# Patient Record
Sex: Female | Born: 1995 | Race: White | Hispanic: No | Marital: Single | Smoking: Former smoker
Health system: Southern US, Community
[De-identification: ages and names within clinical notes are randomized; demographics above are authoritative.]

## PROBLEM LIST (undated history)

## (undated) DIAGNOSIS — O139 Gestational [pregnancy-induced] hypertension without significant proteinuria, unspecified trimester: Secondary | ICD-10-CM

## (undated) DIAGNOSIS — F419 Anxiety disorder, unspecified: Secondary | ICD-10-CM

## (undated) DIAGNOSIS — O24419 Gestational diabetes mellitus in pregnancy, unspecified control: Secondary | ICD-10-CM

## (undated) DIAGNOSIS — F329 Major depressive disorder, single episode, unspecified: Secondary | ICD-10-CM

## (undated) DIAGNOSIS — F32A Depression, unspecified: Secondary | ICD-10-CM

## (undated) DIAGNOSIS — J45909 Unspecified asthma, uncomplicated: Secondary | ICD-10-CM

## (undated) HISTORY — PX: OTHER SURGICAL HISTORY: SHX169

---
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
## 2018-10-06 DIAGNOSIS — O26899 Other specified pregnancy related conditions, unspecified trimester: Secondary | ICD-10-CM | POA: Insufficient documentation

## 2018-10-06 DIAGNOSIS — Z6791 Unspecified blood type, Rh negative: Secondary | ICD-10-CM | POA: Insufficient documentation

## 2019-04-27 DIAGNOSIS — O365990 Maternal care for other known or suspected poor fetal growth, unspecified trimester, not applicable or unspecified: Secondary | ICD-10-CM

## 2019-08-08 ENCOUNTER — Emergency Department (HOSPITAL_COMMUNITY)
Admission: EM | Admit: 2019-08-08 | Discharge: 2019-08-08 | Disposition: A | Discharge status: Home / Self Care | Payer: Medicaid Other | Attending: Emergency Medicine | Admitting: Emergency Medicine

## 2019-08-08 ENCOUNTER — Other Ambulatory Visit: Payer: Self-pay

## 2019-08-08 ENCOUNTER — Encounter (HOSPITAL_COMMUNITY): Payer: Self-pay | Admitting: Emergency Medicine

## 2019-08-08 DIAGNOSIS — F17210 Nicotine dependence, cigarettes, uncomplicated: Secondary | ICD-10-CM | POA: Diagnosis not present

## 2019-08-08 DIAGNOSIS — K0889 Other specified disorders of teeth and supporting structures: Secondary | ICD-10-CM | POA: Insufficient documentation

## 2019-08-08 HISTORY — DX: Depression, unspecified: F32.A

## 2019-08-08 MED ORDER — CLINDAMYCIN HCL 150 MG PO CAPS: 300.0000 mg | ORAL_CAPSULE | Freq: Four times a day (QID) | ORAL | 0 refills | Status: DC

## 2019-08-08 MED ORDER — CHLORHEXIDINE GLUCONATE 0.12 % MT SOLN: OROMUCOSAL | 0 refills | Status: DC

## 2019-08-08 NOTE — ED Provider Notes (Signed)
[facility] EMERGENCY DEPARTMENT Provider Note   CSN: [id number] Arrival date & time: 08/08/19  0009     History Chief Complaint  Patient presents with  . Dental Pain    Kimberly Houston is a 24 y.o. female.  Patient presents to the emergency department for evaluation of dental pain.  Symptoms have been present for 2 days.  Patient reports pain in the whole right side of her mouth.  She reports that it hurts to eat and to smoke cigarettes.  She has not had any fever.        Past Medical History:  Diagnosis Date  . Depression     There are no problems to display for this patient.   Past Surgical History:  Procedure Laterality Date  . c section       OB History   No obstetric history on file.     No family history on file.  Social History   Tobacco Use  . Smoking status: Current Every Day Smoker    Types: Cigarettes  . Smokeless tobacco: Never Used  Substance Use Topics  . Alcohol use: Yes    Comment: occ  . Drug use: Not Currently    Home Medications Prior to Admission medications   Medication Sig Start Date End Date Taking? Authorizing Provider  chlorhexidine (PERIDEX) 0.12 % solution Swish and spit 15 mL 2 times a day 08/08/19   Gilda Crease, MD  clindamycin (CLEOCIN) 150 MG capsule Take 2 capsules (300 mg total) by mouth 4 (four) times daily. 08/08/19   Gilda Crease, MD    Allergies    Amoxicillin, Drug ingredient [azithromycin], Gentamicin, and Penicillins  Review of Systems   Review of Systems  HENT: Positive for dental problem.   All other systems reviewed and are negative.   Physical Exam Updated Vital Signs BP 126/68 (BP Location: Right Arm)   Pulse 73   Temp 98.1 F (36.7 C) (Oral)   Resp 17   Ht 5\' 2"  (1.575 m)   Wt 118.8 kg   LMP 07/18/2019   SpO2 100%   BMI 47.92 kg/m   Physical Exam Vitals and nursing note reviewed.  Constitutional:      General: She is not in acute distress.    Appearance: Normal  appearance. She is well-developed.  HENT:     Head: Normocephalic and atraumatic.     Comments: Diffuse gingival swelling on the right side, no obvious caries, no dental abscess    Right Ear: Hearing normal.     Left Ear: Hearing normal.     Nose: Nose normal.  Eyes:     Conjunctiva/sclera: Conjunctivae normal.     Pupils: Pupils are equal, round, and reactive to light.  Cardiovascular:     Rate and Rhythm: Regular rhythm.     Heart sounds: S1 normal and S2 normal. No murmur. No friction rub. No gallop.   Pulmonary:     Effort: Pulmonary effort is normal. No respiratory distress.     Breath sounds: Normal breath sounds.  Chest:     Chest wall: No tenderness.  Abdominal:     General: Bowel sounds are normal.     Palpations: Abdomen is soft.     Tenderness: There is no abdominal tenderness. There is no guarding or rebound. Negative signs include Murphy's sign and McBurney's sign.     Hernia: No hernia is present.  Musculoskeletal:        General: Normal range of motion.  Cervical back: Normal range of motion and neck supple.  Skin:    General: Skin is warm and dry.     Findings: No rash.  Neurological:     Mental Status: She is alert and oriented to person, place, and time.     GCS: GCS eye subscore is 4. GCS verbal subscore is 5. GCS motor subscore is 6.     Cranial Nerves: No cranial nerve deficit.     Sensory: No sensory deficit.     Coordination: Coordination normal.  Psychiatric:        Speech: Speech normal.        Behavior: Behavior normal.        Thought Content: Thought content normal.     ED Results / Procedures / Treatments   Labs (all labs ordered are listed, but only abnormal results are displayed) Labs Reviewed - No data to display  EKG None  Radiology No results found.  Procedures Procedures (including critical care time)  Medications Ordered in ED Medications - No data to display  ED Course  I have reviewed the triage vital signs and the  nursing notes.  Pertinent labs & imaging results that were available during my care of the patient were reviewed by me and considered in my medical decision making (see chart for details).    MDM Rules/Calculators/A&P                      Patient presents with diffuse dental pain.  I do not see an obvious source in the patient is experiencing pain in multiple areas on the right side of her mouth, both upper and lower.  She does have diffuse gingival swelling, will treat with Peridex and Clinda, follow-up with a dentist.  Final Clinical Impression(s) / ED Diagnoses Final diagnoses:  Pain, dental    Rx / DC Orders ED Discharge Orders         Ordered    clindamycin (CLEOCIN) 150 MG capsule  4 times daily     08/08/19 0139    chlorhexidine (PERIDEX) 0.12 % solution     08/08/19 0139           Orpah Greek, MD 08/08/19 0140

## 2019-08-08 NOTE — ED Triage Notes (Signed)
Pt c/o dental pain x 2 days.  

## 2019-08-26 ENCOUNTER — Emergency Department (HOSPITAL_COMMUNITY): Payer: Medicaid Other

## 2019-08-26 ENCOUNTER — Encounter (HOSPITAL_COMMUNITY): Payer: Self-pay | Admitting: Emergency Medicine

## 2019-08-26 ENCOUNTER — Other Ambulatory Visit: Payer: Self-pay

## 2019-08-26 ENCOUNTER — Emergency Department (HOSPITAL_COMMUNITY)
Admission: EM | Admit: 2019-08-26 | Discharge: 2019-08-26 | Disposition: A | Discharge status: Home / Self Care | Payer: Medicaid Other | Attending: Emergency Medicine | Admitting: Emergency Medicine

## 2019-08-26 DIAGNOSIS — Y990 Civilian activity done for income or pay: Secondary | ICD-10-CM | POA: Insufficient documentation

## 2019-08-26 DIAGNOSIS — X501XXA Overexertion from prolonged static or awkward postures, initial encounter: Secondary | ICD-10-CM | POA: Insufficient documentation

## 2019-08-26 DIAGNOSIS — S8392XA Sprain of unspecified site of left knee, initial encounter: Secondary | ICD-10-CM | POA: Diagnosis not present

## 2019-08-26 DIAGNOSIS — Y929 Unspecified place or not applicable: Secondary | ICD-10-CM | POA: Diagnosis not present

## 2019-08-26 DIAGNOSIS — F17210 Nicotine dependence, cigarettes, uncomplicated: Secondary | ICD-10-CM | POA: Diagnosis not present

## 2019-08-26 DIAGNOSIS — Y939 Activity, unspecified: Secondary | ICD-10-CM | POA: Insufficient documentation

## 2019-08-26 DIAGNOSIS — Z3202 Encounter for pregnancy test, result negative: Secondary | ICD-10-CM | POA: Insufficient documentation

## 2019-08-26 DIAGNOSIS — M25571 Pain in right ankle and joints of right foot: Secondary | ICD-10-CM | POA: Diagnosis not present

## 2019-08-26 DIAGNOSIS — S8992XA Unspecified injury of left lower leg, initial encounter: Secondary | ICD-10-CM | POA: Diagnosis present

## 2019-08-26 LAB — PREGNANCY, URINE: Preg Test, Ur: NEGATIVE

## 2019-08-26 MED ORDER — IBUPROFEN 800 MG PO TABS
800.0000 mg | ORAL_TABLET | Freq: Once | ORAL | Status: AC
  Administered 2019-08-26: 22:00:00 800 mg via ORAL
  Filled 2019-08-26: qty 1

## 2019-08-26 NOTE — ED Provider Notes (Signed)
[facility] EMERGENCY DEPARTMENT Provider Note   CSN: [id number] Arrival date & time: 08/26/19  1947     History Chief Complaint  Patient presents with  . Ankle Pain    Kimberly Houston is a 24 y.o. female with a past medical history significant for asthma and depression who presents to the ED due to acute on chronic right ankle pain, left knee pain, and requesting a pregnancy test.  Patient states she was at work today when she stepped wrong onto her left leg and twisted her left knee.  Admits to immediate pain following the incident.  Left knee pain associated with numbness and tingling that travels down her left leg to her left foot.  Denies previous injury to left knee.  Has not tried anything for pain prior to arrival.  Patient also admits to acute on chronic right ankle pain.  Notes she broke her ankle a few years ago and has had chronic pain since; however, pain has been worse today and she is concerned about a hairline fracture.  No further injury to right ankle.  Admits to intermittent numbness/tingling of her right foot.  Patient also notes she is sexually active without protection and has possible concerns about pregnancy.  She is currently on her period, but admits to intermittent nausea and breast tenderness.  Patient denies fever, chills, erythema, and warmth to right ankle/left knee.  History obtained from patient and past medical records. No interpreter used during encounter.      Past Medical History:  Diagnosis Date  . Depression     There are no problems to display for this patient.   Past Surgical History:  Procedure Laterality Date  . c section       OB History   No obstetric history on file.     No family history on file.  Social History   Tobacco Use  . Smoking status: Current Every Day Smoker    Types: Cigarettes  . Smokeless tobacco: Never Used  Substance Use Topics  . Alcohol use: Yes    Comment: occ  . Drug use: Not Currently    Home  Medications Prior to Admission medications   Medication Sig Start Date End Date Taking? Authorizing Provider  chlorhexidine (PERIDEX) 0.12 % solution Swish and spit 15 mL 2 times a day 08/08/19   Gilda Crease, MD  clindamycin (CLEOCIN) 150 MG capsule Take 2 capsules (300 mg total) by mouth 4 (four) times daily. 08/08/19   Gilda Crease, MD    Allergies    Amoxicillin, Drug ingredient [azithromycin], Gentamicin, and Penicillins  Review of Systems   Review of Systems  Constitutional: Negative for chills and fever.  Gastrointestinal: Positive for nausea. Negative for abdominal pain, diarrhea and vomiting.  Musculoskeletal: Positive for arthralgias.  Neurological: Positive for numbness.  All other systems reviewed and are negative.   Physical Exam Updated Vital Signs BP 124/70 (BP Location: Right Arm)   Pulse 84   Temp 98.8 F (37.1 C) (Oral)   Resp 18   Ht 5\' 2"  (1.575 m)   Wt 118 kg   LMP 08/25/2019   SpO2 100%   BMI 47.58 kg/m   Physical Exam Vitals and nursing note reviewed.  Constitutional:      General: She is not in acute distress.    Appearance: She is not ill-appearing.  HENT:     Head: Normocephalic.  Eyes:     Pupils: Pupils are equal, round, and reactive to light.  Cardiovascular:  Rate and Rhythm: Normal rate and regular rhythm.     Pulses: Normal pulses.     Heart sounds: Normal heart sounds. No murmur. No friction rub. No gallop.   Pulmonary:     Effort: Pulmonary effort is normal.     Breath sounds: Normal breath sounds.  Abdominal:     General: Abdomen is flat. Bowel sounds are normal. There is no distension.     Palpations: Abdomen is soft.     Tenderness: There is no abdominal tenderness. There is no guarding or rebound.  Musculoskeletal:     Cervical back: Neck supple.     Comments: Tenderness palpation throughout right ankle.  Limited range of motion of right ankle due to pain.  Full range of motion of right knee and right  toes.  Right lower extremity neurovascularly intact.  Tenderness palpation over anterior aspect of left knee.  Moderate range of motion of left knee.  Left lower extremity neurovascularly intact.  Full range of motion of left hip and left ankle.  No erythema, edema, or warmth of either right ankle or left knee.  Skin:    General: Skin is warm and dry.  Neurological:     General: No focal deficit present.     Mental Status: She is alert.  Psychiatric:        Mood and Affect: Mood normal.        Behavior: Behavior normal.     ED Results / Procedures / Treatments   Labs (all labs ordered are listed, but only abnormal results are displayed) Labs Reviewed  PREGNANCY, URINE    EKG None  Radiology DG Ankle Complete Right  Result Date: 08/26/2019 CLINICAL DATA:  Pain, previous injury EXAM: RIGHT ANKLE - COMPLETE 3+ VIEW COMPARISON:  None. FINDINGS: There is no evidence of fracture, dislocation, or joint effusion. There is no evidence of arthropathy or other focal bone abnormality. Soft tissues are unremarkable. IMPRESSION: Negative. Electronically Signed   By: Jasmine Pang M.D.   On: 08/26/2019 22:08   DG Knee Complete 4 Views Left  Result Date: 08/26/2019 CLINICAL DATA:  Pain, previous injury EXAM: LEFT KNEE - COMPLETE 4+ VIEW COMPARISON:  None. FINDINGS: No evidence of fracture, dislocation, or joint effusion. No evidence of arthropathy or other focal bone abnormality. Soft tissues are unremarkable. IMPRESSION: Negative. Electronically Signed   By: Jasmine Pang M.D.   On: 08/26/2019 22:08    Procedures Procedures (including critical care time)  Medications Ordered in ED Medications  ibuprofen (ADVIL) tablet 800 mg (800 mg Oral Given 08/26/19 2133)    ED Course  I have reviewed the triage vital signs and the nursing notes.  Pertinent labs & imaging results that were available during my care of the patient were reviewed by me and considered in my medical decision making (see chart  for details).  Clinical Course as of Aug 25 2249  Sat Aug 26, 2019  2248 Preg Test, Ur: NEGATIVE [CA]    Clinical Course User Index [CA] Mannie Stabile, PA-C   MDM Rules/Calculators/A&P                     24 year old female presents to the ED due to right ankle pain, left knee pain, and requesting pregnancy test.  Patient admits to a remote injury to her right ankle and has been having chronic pain since.  Patient twisted her left knee earlier today while at work.  Vitals all within normal limits.  Patient no  acute distress and non-ill-appearing.  Physical exam reassuring.  Both lower extremities neurovascularly intact.  No erythema, edema, or warmth to suggest septic arthritis.  Will obtain x-ray of right ankle, and left knee to rule out bony fractures.  Also obtain pregnancy test.  Pregnancy test negative.  X-rays personally reviewed which are negative for bony fractures.  Will place right ankle in splint for comfort.  Ace wrap placed around left knee for stability and comfort.  Instructed patient to take over-the-counter ibuprofen or Tylenol as needed for pain.  Advised patient to follow-up with PCP if symptoms do not improve within the next week. Strict ED precautions discussed with patient. Patient states understanding and agrees to plan. Patient discharged home in no acute distress and stable vitals. Final Clinical Impression(s) / ED Diagnoses Final diagnoses:  Acute right ankle pain  Sprain of left knee, unspecified ligament, initial encounter  Pregnancy examination or test, negative result    Rx / DC Orders ED Discharge Orders    None       Karie Kirks 08/26/19 2253    Margette Fast, MD 08/27/19 1205

## 2019-08-26 NOTE — Discharge Instructions (Addendum)
As discussed, your x-rays were negative for any broken bones.  Your pregnancy test was negative.  You may take over-the-counter ibuprofen or Tylenol as needed for pain.  If your symptoms do not improve within the next week, follow-up with PCP for further evaluation.  Continue to ice and elevate your right ankle and left knee for further pain management.  Return to the ER for new or worsening symptoms.

## 2019-08-26 NOTE — ED Triage Notes (Signed)
Pt c/o right ankle pain and left knee pain that began today at work. Patient also request a pregnancy test.

## 2019-08-31 ENCOUNTER — Emergency Department (HOSPITAL_COMMUNITY)
Admission: EM | Admit: 2019-08-31 | Discharge: 2019-08-31 | Disposition: A | Discharge status: Home / Self Care | Payer: Medicaid Other | Attending: Emergency Medicine | Admitting: Emergency Medicine

## 2019-08-31 ENCOUNTER — Encounter (HOSPITAL_COMMUNITY): Payer: Self-pay | Admitting: Emergency Medicine

## 2019-08-31 ENCOUNTER — Other Ambulatory Visit: Payer: Self-pay

## 2019-08-31 DIAGNOSIS — N3090 Cystitis, unspecified without hematuria: Secondary | ICD-10-CM | POA: Diagnosis not present

## 2019-08-31 DIAGNOSIS — J45909 Unspecified asthma, uncomplicated: Secondary | ICD-10-CM | POA: Insufficient documentation

## 2019-08-31 DIAGNOSIS — M545 Low back pain: Secondary | ICD-10-CM | POA: Insufficient documentation

## 2019-08-31 DIAGNOSIS — R3911 Hesitancy of micturition: Secondary | ICD-10-CM | POA: Diagnosis not present

## 2019-08-31 DIAGNOSIS — R1030 Lower abdominal pain, unspecified: Secondary | ICD-10-CM | POA: Diagnosis not present

## 2019-08-31 DIAGNOSIS — R300 Dysuria: Secondary | ICD-10-CM | POA: Diagnosis present

## 2019-08-31 DIAGNOSIS — F17210 Nicotine dependence, cigarettes, uncomplicated: Secondary | ICD-10-CM | POA: Diagnosis not present

## 2019-08-31 HISTORY — DX: Unspecified asthma, uncomplicated: J45.909

## 2019-08-31 LAB — URINALYSIS, ROUTINE W REFLEX MICROSCOPIC
Bilirubin Urine: NEGATIVE
Glucose, UA: NEGATIVE mg/dL
Ketones, ur: NEGATIVE mg/dL
Nitrite: NEGATIVE
Protein, ur: 100 mg/dL — AB
Specific Gravity, Urine: 1.016 (ref 1.005–1.030)
WBC, UA: >50 WBC/hpf — ABNORMAL HIGH (ref 0–5)
pH: 5 (ref 5.0–8.0)

## 2019-08-31 LAB — POC URINE PREG, ED: Preg Test, Ur: NEGATIVE

## 2019-08-31 MED ORDER — CEPHALEXIN 500 MG PO CAPS: 500.0000 mg | ORAL_CAPSULE | Freq: Three times a day (TID) | ORAL | 0 refills | Status: DC

## 2019-08-31 MED ORDER — CEPHALEXIN 500 MG PO CAPS: 500.0000 mg | ORAL_CAPSULE | Freq: Three times a day (TID) | ORAL | 0 refills | Status: AC

## 2019-08-31 NOTE — ED Triage Notes (Signed)
Pt reports dysuria, urinary frequency without complete emptying x2 days. Pt denies hematuria, n/v/fever. Pt reports intermittent lower abdominal/bladder pain.

## 2019-08-31 NOTE — ED Provider Notes (Signed)
[facility] EMERGENCY DEPARTMENT Provider Note  CSN: [id number] Arrival date & time: 08/31/19 1124    History Chief Complaint  Patient presents with  . Dysuria    HPI  Kimberly Houston is a 24 y.o. female with no significant past medical history who presents to the ED for evaluation of two days of dysuria. Reports stabbing pain in suprapubic area, worse with urination and associated with urinary hesitancy. She denies fever or vomiting. She reports some bilateral lower back pain.    Past Medical History:  Diagnosis Date  . Asthma   . Depression     Past Surgical History:  Procedure Laterality Date  . c section    . TYMPANOSTOMY TUBE PLACEMENT  2018    History reviewed. No pertinent family history.  Social History   Tobacco Use  . Smoking status: Current Every Day Smoker    Packs/day: 0.50    Types: Cigarettes  . Smokeless tobacco: Never Used  Substance Use Topics  . Alcohol use: Yes    Comment: occ  . Drug use: Not Currently     Home Medications Prior to Admission medications   Medication Sig Start Date End Date Taking? Authorizing Provider  cephALEXin (KEFLEX) 500 MG capsule Take 1 capsule (500 mg total) by mouth 3 (three) times daily for 7 days. 08/31/19 09/07/19  Pollyann Savoy, MD  chlorhexidine (PERIDEX) 0.12 % solution Swish and spit 15 mL 2 times a day 08/08/19   Gilda Crease, MD     Allergies    Amoxicillin, Drug ingredient [azithromycin], Gentamicin, and Penicillins   Review of Systems   Review of Systems  Constitutional: Negative for fever.  HENT: Negative for congestion and sore throat.   Respiratory: Negative for cough and shortness of breath.   Cardiovascular: Negative for chest pain.  Gastrointestinal: Negative for abdominal pain, diarrhea, nausea and vomiting.  Genitourinary: Positive for difficulty urinating, dysuria and frequency.  Musculoskeletal: Positive for back pain. Negative for myalgias.  Skin: Negative for rash.    Neurological: Negative for headaches.  Psychiatric/Behavioral: Negative for behavioral problems.     Physical Exam BP 123/68 (BP Location: Right Arm)   Pulse 84   Temp 98.3 F (36.8 C) (Oral)   Resp 18   Ht 5\' 2"  (1.575 m)   Wt 118 kg   LMP 08/25/2019   SpO2 100%   BMI 47.58 kg/m   Physical Exam Constitutional:      Appearance: Normal appearance.  HENT:     Head: Normocephalic and atraumatic.     Nose: Nose normal.     Mouth/Throat:     Mouth: Mucous membranes are moist.  Eyes:     Extraocular Movements: Extraocular movements intact.     Conjunctiva/sclera: Conjunctivae normal.  Cardiovascular:     Rate and Rhythm: Normal rate.  Pulmonary:     Effort: Pulmonary effort is normal.     Breath sounds: Normal breath sounds.  Abdominal:     General: Abdomen is flat.     Palpations: Abdomen is soft.     Tenderness: There is no abdominal tenderness.  Genitourinary:    Comments: No CVA tenderness Musculoskeletal:        General: No swelling. Normal range of motion.     Cervical back: Neck supple.  Skin:    General: Skin is warm and dry.  Neurological:     General: No focal deficit present.     Mental Status: She is alert.  Psychiatric:  Mood and Affect: Mood normal.      ED Results / Procedures / Treatments   Labs (all labs ordered are listed, but only abnormal results are displayed) Labs Reviewed  URINALYSIS, ROUTINE W REFLEX MICROSCOPIC - Abnormal; Notable for the following components:      Result Value   APPearance CLOUDY (*)    Hgb urine dipstick LARGE (*)    Protein, ur 100 (*)    Leukocytes,Ua MODERATE (*)    WBC, UA >50 (*)    Bacteria, UA RARE (*)    All other components within normal limits  POC URINE PREG, ED    EKG None  Radiology No results found.  Procedures Procedures  Medications Ordered in the ED Medications - No data to display   ED Course  I have reviewed the triage vital signs and the nursing notes.  Pertinent  labs & imaging results that were available during my care of the patient were reviewed by me and considered in my medical decision making (see chart for details).  Clinical Course as of Aug 30 1517  Thu Aug 31, 2019  1516 Patient non toxic appearing with symptoms and UA consistent with acute cystitis. Doubt pyelonephritis or kidney stone given lack of unilateral CVA pain or colic. She has had rash with PCN, but should be safe for Keflex. Recommend PCP followup for recheck and RTED if symptoms worse, for fever, vomiting or any other concerns.    [CS]    Clinical Course User Index [CS] Truddie Hidden, MD    MDM Rules/Calculators/A&P MDM  Final Clinical Impression(s) / ED Diagnoses Final diagnoses:  Cystitis    Rx / DC Orders ED Discharge Orders         Ordered    cephALEXin (KEFLEX) 500 MG capsule  3 times daily     08/31/19 1518           Truddie Hidden, MD 08/31/19 [phone]

## 2019-09-14 ENCOUNTER — Other Ambulatory Visit: Payer: Self-pay

## 2019-09-14 ENCOUNTER — Emergency Department (HOSPITAL_COMMUNITY)
Admission: EM | Admit: 2019-09-14 | Discharge: 2019-09-15 | Disposition: A | Discharge status: Other Acute Inpatient Hospital | Payer: Medicaid Other | Attending: Emergency Medicine | Admitting: Emergency Medicine

## 2019-09-14 ENCOUNTER — Encounter (HOSPITAL_COMMUNITY): Payer: Self-pay

## 2019-09-14 DIAGNOSIS — F17210 Nicotine dependence, cigarettes, uncomplicated: Secondary | ICD-10-CM | POA: Insufficient documentation

## 2019-09-14 DIAGNOSIS — R45851 Suicidal ideations: Secondary | ICD-10-CM | POA: Diagnosis not present

## 2019-09-14 DIAGNOSIS — Z20822 Contact with and (suspected) exposure to covid-19: Secondary | ICD-10-CM | POA: Diagnosis not present

## 2019-09-14 DIAGNOSIS — F332 Major depressive disorder, recurrent severe without psychotic features: Secondary | ICD-10-CM | POA: Insufficient documentation

## 2019-09-14 DIAGNOSIS — Z79899 Other long term (current) drug therapy: Secondary | ICD-10-CM | POA: Diagnosis not present

## 2019-09-14 LAB — RAPID URINE DRUG SCREEN, HOSP PERFORMED
Amphetamines: NOT DETECTED
Barbiturates: NOT DETECTED
Benzodiazepines: NOT DETECTED
Cocaine: NOT DETECTED
Opiates: NOT DETECTED
Tetrahydrocannabinol: NOT DETECTED

## 2019-09-14 LAB — CBC
HCT: 37.9 % (ref 36.0–46.0)
Hemoglobin: 12.1 g/dL (ref 12.0–15.0)
MCH: 28.5 pg (ref 26.0–34.0)
MCHC: 31.9 g/dL (ref 30.0–36.0)
MCV: 89.4 fL (ref 80.0–100.0)
Platelets: 403 10*3/uL — ABNORMAL HIGH (ref 150–400)
RBC: 4.24 MIL/uL (ref 3.87–5.11)
RDW: 13.9 % (ref 11.5–15.5)
WBC: 10.2 10*3/uL (ref 4.0–10.5)
nRBC: 0 % (ref 0.0–0.2)

## 2019-09-14 LAB — COMPREHENSIVE METABOLIC PANEL
ALT: 25 U/L (ref 0–44)
AST: 20 U/L (ref 15–41)
Albumin: 4 g/dL (ref 3.5–5.0)
Alkaline Phosphatase: 61 U/L (ref 38–126)
Anion gap: 10 (ref 5–15)
BUN: 14 mg/dL (ref 6–20)
CO2: 23 mmol/L (ref 22–32)
Calcium: 8.8 mg/dL — ABNORMAL LOW (ref 8.9–10.3)
Chloride: 101 mmol/L (ref 98–111)
Creatinine, Ser: 0.81 mg/dL (ref 0.44–1.00)
GFR calc Af Amer: >60 mL/min (ref >60)
GFR calc non Af Amer: >60 mL/min (ref >60)
Glucose, Bld: 95 mg/dL (ref 70–99)
Potassium: 3.6 mmol/L (ref 3.5–5.1)
Sodium: 134 mmol/L — ABNORMAL LOW (ref 135–145)
Total Bilirubin: 0.8 mg/dL (ref 0.3–1.2)
Total Protein: 7.9 g/dL (ref 6.5–8.1)

## 2019-09-14 LAB — ACETAMINOPHEN LEVEL: Acetaminophen (Tylenol), Serum: <10 ug/mL — ABNORMAL LOW (ref 10–30)

## 2019-09-14 LAB — RESPIRATORY PANEL BY RT PCR (FLU A&B, COVID)
Influenza A by PCR: NEGATIVE
Influenza B by PCR: NEGATIVE
SARS Coronavirus 2 by RT PCR: NEGATIVE

## 2019-09-14 LAB — ETHANOL: Alcohol, Ethyl (B): <10 mg/dL (ref <10)

## 2019-09-14 LAB — POC URINE PREG, ED: Preg Test, Ur: NEGATIVE

## 2019-09-14 LAB — SALICYLATE LEVEL: Salicylate Lvl: <7 mg/dL — ABNORMAL LOW (ref 7.0–30.0)

## 2019-09-14 MED ORDER — SERTRALINE HCL 50 MG PO TABS
25.0000 mg | ORAL_TABLET | Freq: Every day | ORAL | Status: DC
  Administered 2019-09-14: 25 mg via ORAL
  Filled 2019-09-14: qty 1

## 2019-09-14 MED ORDER — ALBUTEROL SULFATE HFA 108 (90 BASE) MCG/ACT IN AERS: 2.0000 | INHALATION_SPRAY | Freq: Four times a day (QID) | RESPIRATORY_TRACT | Status: DC | PRN

## 2019-09-14 NOTE — Progress Notes (Signed)
Patient meets inpatient criteria per Renaye Rakers, NP. Patient has been faxed out to the following facilities for review:   [facility]  [facility]  [facility]  [facility]  [facility]  TTS will continue to follow and assist with securing bed placement.   Drucilla Schmidt, MSW, LCSW-A Clinical Disposition Social Worker [facility]/TTS [phone]
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
## 2019-09-14 NOTE — ED Notes (Signed)
Pt. Talking to TTS. 

## 2019-09-14 NOTE — ED Provider Notes (Signed)
[facility] EMERGENCY DEPARTMENT Provider Note   CSN: [id number] Arrival date & time: 09/14/19  1756     History Chief Complaint  Patient presents with  . V70.1    Kimberly Houston is a 24 y.o. female.  HPI   24 year old female with a history of asthma, anxiety/depression, who presents to the emergency department today for evaluation of suicidal ideation.  States she has been feeling suicidal recently because the 1 year anniversary of her uncles death just past.  She states that he was like a father to her and they were very close.  She is really struggling with his loss.  States she has a plan to get into bathtub and slit her wrists.  She has had suicidal ideations in the past that she has acted on.  She reports taking Zoloft for depression and has been compliant with this.  She is not currently in therapy.  She denies any HI or AVH.  Denies any drug or alcohol use.  She denies any medical complaints at this time.  Past Medical History:  Diagnosis Date  . Asthma   . Depression     There are no problems to display for this patient.   Past Surgical History:  Procedure Laterality Date  . c section    . TYMPANOSTOMY TUBE PLACEMENT  2018     OB History   No obstetric history on file.     No family history on file.  Social History   Tobacco Use  . Smoking status: Current Every Day Smoker    Packs/day: 0.50    Types: Cigarettes  . Smokeless tobacco: Never Used  Substance Use Topics  . Alcohol use: Yes    Comment: occ  . Drug use: Not Currently    Home Medications Prior to Admission medications   Medication Sig Start Date End Date Taking? Authorizing Provider  albuterol (VENTOLIN HFA) 108 (90 Base) MCG/ACT inhaler Inhale 2 puffs into the lungs every 6 (six) hours as needed for wheezing or shortness of breath.   Yes [provider]  sertraline (ZOLOFT) 25 MG tablet Take 25 mg by mouth at bedtime. 07/27/19  Yes [provider]  chlorhexidine  (PERIDEX) 0.12 % solution Swish and spit 15 mL 2 times a day Patient not taking: Reported on 09/14/2019 08/08/19   Orpah Greek, MD    Allergies    Amoxicillin, Drug ingredient [azithromycin], Erythromycin, Gentamicin, Latex, Metronidazole, and Penicillins  Review of Systems   Review of Systems  Constitutional: Negative for chills and fever.  HENT: Negative for ear pain and sore throat.   Eyes: Negative for visual disturbance.  Respiratory: Negative for cough and shortness of breath.   Cardiovascular: Negative for chest pain.  Gastrointestinal: Negative for abdominal pain, constipation, diarrhea, nausea and vomiting.  Genitourinary: Negative for dysuria and hematuria.  Musculoskeletal: Negative for back pain.  Skin: Negative for rash.  Neurological: Negative for headaches.  Psychiatric/Behavioral: Positive for dysphoric mood and suicidal ideas.  All other systems reviewed and are negative.   Physical Exam Updated Vital Signs BP 118/68 (BP Location: Left Arm)   Pulse 91   Temp 98.3 F (36.8 C) (Oral)   Resp 18   Ht 5\' 2"  (1.575 m)   Wt 113.4 kg   LMP 08/25/2019   SpO2 99%   BMI 45.73 kg/m   Physical Exam Vitals and nursing note reviewed.  Constitutional:      General: She is not in acute distress.    Appearance: She  is well-developed.  HENT:     Head: Normocephalic and atraumatic.  Eyes:     Conjunctiva/sclera: Conjunctivae normal.  Cardiovascular:     Rate and Rhythm: Normal rate and regular rhythm.     Heart sounds: No murmur.  Pulmonary:     Effort: Pulmonary effort is normal. No respiratory distress.     Breath sounds: Normal breath sounds.  Abdominal:     Palpations: Abdomen is soft.     Tenderness: There is no abdominal tenderness.  Musculoskeletal:     Cervical back: Neck supple.  Skin:    General: Skin is warm and dry.  Neurological:     Mental Status: She is alert.  Psychiatric:        Attention and Perception: Attention normal.         Mood and Affect: Affect is flat.        Speech: Speech normal.        Behavior: Behavior normal. Behavior is cooperative.        Thought Content: Thought content is not paranoid or delusional. Thought content includes suicidal ideation. Thought content does not include homicidal ideation. Thought content includes suicidal plan. Thought content does not include homicidal plan.     ED Results / Procedures / Treatments   Labs (all labs ordered are listed, but only abnormal results are displayed) Labs Reviewed  COMPREHENSIVE METABOLIC PANEL - Abnormal; Notable for the following components:      Result Value   Sodium 134 (*)    Calcium 8.8 (*)    All other components within normal limits  SALICYLATE LEVEL - Abnormal; Notable for the following components:   Salicylate Lvl <7.0 (*)    All other components within normal limits  ACETAMINOPHEN LEVEL - Abnormal; Notable for the following components:   Acetaminophen (Tylenol), Serum <10 (*)    All other components within normal limits  CBC - Abnormal; Notable for the following components:   Platelets 403 (*)    All other components within normal limits  RESPIRATORY PANEL BY RT PCR (FLU A&B, COVID)  ETHANOL  RAPID URINE DRUG SCREEN, HOSP PERFORMED  POC URINE PREG, ED    EKG None  Radiology No results found.  Procedures Procedures (including critical care time)  Medications Ordered in ED Medications  albuterol (VENTOLIN HFA) 108 (90 Base) MCG/ACT inhaler 2 puff (has no administration in time range)  sertraline (ZOLOFT) tablet 25 mg (has no administration in time range)    ED Course  I have reviewed the triage vital signs and the nursing notes.  Pertinent labs & imaging results that were available during my care of the patient were reviewed by me and considered in my medical decision making (see chart for details).    MDM Rules/Calculators/A&P                      24 year old female presenting for evaluation of suicidal  ideation with plan to cut her wrists while in the bathtub.  Denies HI or AVH.  No drug or alcohol use.  Does not have medical complaints at this time.  CBC is without leukocytosis.  She does have mild thrombocytosis CMP is nonacute EtOH is negative Salicylate and acetaminophen levels are negative. Urine pregnancy test is negative UDS is negative  Patient does not appear to have any emergent medical complaint at this time that would require further work-up or admission.  Feel she is appropriate for TTS evaluation.  Pt evaluated by psych and  meets inpatient criteria. Home meds ordered.   The patient has been placed in psychiatric observation due to the need to provide a safe environment for the patient while obtaining psychiatric consultation and evaluation, as well as ongoing medical and medication management to treat the patient's condition.  The patient has not been placed under full IVC at this time.   Final Clinical Impression(s) / ED Diagnoses Final diagnoses:  Suicidal ideation    Rx / DC Orders ED Discharge Orders    None       Rayne Du 09/14/19 2150    Zadie Rhine, MD 09/15/19 0320

## 2019-09-14 NOTE — ED Triage Notes (Signed)
Pt reports has been feeling suicidal for the past 2 days.  Pt says her uncle died a year ago and has been very depressed.

## 2019-09-15 NOTE — ED Notes (Signed)
Spoke with Herbert Seta from intake dept. At [facility] intake dept. Who reports pt has been accepted to [facility] in [place] by Dr Barry Dienes on behalf of Norm Parcel and that pt can be transported after nurse to nurse report is given. RN# to call report- [phone]

## 2019-09-15 NOTE — ED Notes (Signed)
[facility] called to give report- informed by RN to call back when transportation arrives to give report. Safe transport called at this time. Pt made aware.

## 2019-10-08 ENCOUNTER — Other Ambulatory Visit: Payer: Self-pay

## 2019-10-08 ENCOUNTER — Emergency Department (HOSPITAL_COMMUNITY): Payer: Medicaid Other

## 2019-10-08 ENCOUNTER — Emergency Department (HOSPITAL_COMMUNITY)
Admission: EM | Admit: 2019-10-08 | Discharge: 2019-10-09 | Disposition: A | Discharge status: Home / Self Care | Payer: Medicaid Other | Attending: Emergency Medicine | Admitting: Emergency Medicine

## 2019-10-08 ENCOUNTER — Encounter (HOSPITAL_COMMUNITY): Payer: Self-pay | Admitting: Emergency Medicine

## 2019-10-08 DIAGNOSIS — Z5321 Procedure and treatment not carried out due to patient leaving prior to being seen by health care provider: Secondary | ICD-10-CM | POA: Insufficient documentation

## 2019-10-08 DIAGNOSIS — M25511 Pain in right shoulder: Secondary | ICD-10-CM | POA: Insufficient documentation

## 2019-10-08 HISTORY — DX: Anxiety disorder, unspecified: F41.9
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
## 2019-10-08 NOTE — ED Notes (Signed)
Pt stated wants PA know  Having pain around her C-section scar C-section occurred 6 months ago

## 2019-10-09 NOTE — ED Notes (Signed)
Pt stating she wants to go home.

## 2019-10-10 ENCOUNTER — Other Ambulatory Visit: Payer: Self-pay

## 2019-10-10 ENCOUNTER — Ambulatory Visit: Payer: Medicaid Other | Attending: Internal Medicine

## 2019-10-10 DIAGNOSIS — Z20822 Contact with and (suspected) exposure to covid-19: Secondary | ICD-10-CM

## 2019-10-11 LAB — SARS-COV-2, NAA 2 DAY TAT

## 2019-10-11 LAB — NOVEL CORONAVIRUS, NAA: SARS-CoV-2, NAA: NOT DETECTED

## 2020-02-06 ENCOUNTER — Emergency Department (HOSPITAL_COMMUNITY)
Admission: EM | Admit: 2020-02-06 | Discharge: 2020-02-06 | Disposition: A | Discharge status: Home / Self Care | Payer: Medicaid Other | Attending: Emergency Medicine | Admitting: Emergency Medicine

## 2020-02-06 ENCOUNTER — Encounter (HOSPITAL_COMMUNITY): Payer: Self-pay | Admitting: *Deleted

## 2020-02-06 ENCOUNTER — Other Ambulatory Visit: Payer: Self-pay

## 2020-02-06 DIAGNOSIS — R112 Nausea with vomiting, unspecified: Secondary | ICD-10-CM | POA: Diagnosis not present

## 2020-02-06 DIAGNOSIS — J45909 Unspecified asthma, uncomplicated: Secondary | ICD-10-CM | POA: Insufficient documentation

## 2020-02-06 DIAGNOSIS — Z7951 Long term (current) use of inhaled steroids: Secondary | ICD-10-CM | POA: Diagnosis not present

## 2020-02-06 DIAGNOSIS — J029 Acute pharyngitis, unspecified: Secondary | ICD-10-CM | POA: Insufficient documentation

## 2020-02-06 DIAGNOSIS — Z20822 Contact with and (suspected) exposure to covid-19: Secondary | ICD-10-CM | POA: Diagnosis not present

## 2020-02-06 DIAGNOSIS — F17210 Nicotine dependence, cigarettes, uncomplicated: Secondary | ICD-10-CM | POA: Insufficient documentation

## 2020-02-06 DIAGNOSIS — R1110 Vomiting, unspecified: Secondary | ICD-10-CM

## 2020-02-06 LAB — CBC
HCT: 38.1 % (ref 36.0–46.0)
Hemoglobin: 12.1 g/dL (ref 12.0–15.0)
MCH: 28.7 pg (ref 26.0–34.0)
MCHC: 31.8 g/dL (ref 30.0–36.0)
MCV: 90.5 fL (ref 80.0–100.0)
Platelets: 319 10*3/uL (ref 150–400)
RBC: 4.21 MIL/uL (ref 3.87–5.11)
RDW: 14.3 % (ref 11.5–15.5)
WBC: 6.7 10*3/uL (ref 4.0–10.5)
nRBC: 0 % (ref 0.0–0.2)

## 2020-02-06 LAB — URINALYSIS, ROUTINE W REFLEX MICROSCOPIC
Bilirubin Urine: NEGATIVE
Glucose, UA: NEGATIVE mg/dL
Hgb urine dipstick: NEGATIVE
Ketones, ur: NEGATIVE mg/dL
Leukocytes,Ua: NEGATIVE
Nitrite: NEGATIVE
Protein, ur: NEGATIVE mg/dL
Specific Gravity, Urine: 1.021 (ref 1.005–1.030)
pH: 6 (ref 5.0–8.0)

## 2020-02-06 LAB — COMPREHENSIVE METABOLIC PANEL
ALT: 17 U/L (ref 0–44)
AST: 15 U/L (ref 15–41)
Albumin: 3.6 g/dL (ref 3.5–5.0)
Alkaline Phosphatase: 43 U/L (ref 38–126)
Anion gap: 6 (ref 5–15)
BUN: 13 mg/dL (ref 6–20)
CO2: 27 mmol/L (ref 22–32)
Calcium: 9.1 mg/dL (ref 8.9–10.3)
Chloride: 105 mmol/L (ref 98–111)
Creatinine, Ser: 0.77 mg/dL (ref 0.44–1.00)
GFR calc Af Amer: >60 mL/min (ref >60)
GFR calc non Af Amer: >60 mL/min (ref >60)
Glucose, Bld: 97 mg/dL (ref 70–99)
Potassium: 4.4 mmol/L (ref 3.5–5.1)
Sodium: 138 mmol/L (ref 135–145)
Total Bilirubin: 0.9 mg/dL (ref 0.3–1.2)
Total Protein: 6.9 g/dL (ref 6.5–8.1)

## 2020-02-06 LAB — SARS CORONAVIRUS 2 BY RT PCR (HOSPITAL ORDER, PERFORMED IN ~~LOC~~ HOSPITAL LAB): SARS Coronavirus 2: NEGATIVE

## 2020-02-06 LAB — LIPASE, BLOOD: Lipase: 39 U/L (ref 11–51)

## 2020-02-06 LAB — GROUP A STREP BY PCR: Group A Strep by PCR: NOT DETECTED

## 2020-02-06 NOTE — ED Triage Notes (Signed)
Pt c/o sob, n/v and sore throat that started x one day

## 2020-02-06 NOTE — ED Notes (Signed)
Pt's POC urine preg test is negative. Results not transferring to electronic chart. EDP notified.

## 2020-02-06 NOTE — Discharge Instructions (Signed)
Return if any problems.

## 2020-02-07 LAB — POC URINE PREG, ED: Preg Test, Ur: NEGATIVE

## 2020-02-07 NOTE — ED Provider Notes (Signed)
[facility] EMERGENCY DEPARTMENT Provider Note   CSN: [id number] Arrival date & time: 02/06/20  1121     History Chief Complaint  Patient presents with  . Sore Throat    Kimberly Houston is a 24 y.o. female.  The history is provided by the patient. No language interpreter was used.  Sore Throat This is a new problem. The current episode started 12 to 24 hours ago. The problem occurs constantly. The problem has not changed since onset.Nothing aggravates the symptoms. Nothing relieves the symptoms. She has tried nothing for the symptoms. The treatment provided no relief.       Past Medical History:  Diagnosis Date  . Anxiety   . Asthma   . Depression     There are no problems to display for this patient.   Past Surgical History:  Procedure Laterality Date  . c section    . TYMPANOSTOMY TUBE PLACEMENT  2018     OB History   No obstetric history on file.     History reviewed. No pertinent family history.  Social History   Tobacco Use  . Smoking status: Current Every Day Smoker    Packs/day: 0.50    Types: Cigarettes  . Smokeless tobacco: Never Used  Vaping Use  . Vaping Use: Every day  Substance Use Topics  . Alcohol use: Yes    Comment: occ  . Drug use: Not Currently    Home Medications Prior to Admission medications   Medication Sig Start Date End Date Taking? Authorizing Provider  albuterol (VENTOLIN HFA) 108 (90 Base) MCG/ACT inhaler Inhale 2 puffs into the lungs every 6 (six) hours as needed for wheezing or shortness of breath.    [provider]  hydrOXYzine (ATARAX/VISTARIL) 25 MG tablet Take 25 mg by mouth daily as needed for itching (allergies).  09/26/19   [provider]  sertraline (ZOLOFT) 100 MG tablet Take 100 mg by mouth at bedtime. 09/19/19   [provider]  Vitamin D, Ergocalciferol, (DRISDOL) 1.25 MG (50000 UNIT) CAPS capsule Take 50,000 Units by mouth every Saturday.  09/19/19   [provider]     Allergies    Amoxicillin, Drug ingredient [azithromycin], Erythromycin, Gentamicin, Metronidazole, Penicillins, and Latex  Review of Systems   Review of Systems  All other systems reviewed and are negative.   Physical Exam Updated Vital Signs BP 126/62   Pulse 65   Temp 98.9 F (37.2 C) (Oral)   Resp 16   Ht 5\' 2"  (1.575 m)   Wt 113.4 kg   SpO2 100%   BMI 45.73 kg/m   Physical Exam Vitals and nursing note reviewed.  Constitutional:      Appearance: She is well-developed.  HENT:     Head: Normocephalic.     Mouth/Throat:     Mouth: Mucous membranes are moist.  Cardiovascular:     Rate and Rhythm: Normal rate.  Pulmonary:     Effort: Pulmonary effort is normal.  Abdominal:     General: There is no distension.     Palpations: Abdomen is soft.  Musculoskeletal:        General: Normal range of motion.     Cervical back: Normal range of motion.  Skin:    General: Skin is warm.  Neurological:     General: No focal deficit present.     Mental Status: She is alert and oriented to person, place, and time.     ED Results / Procedures / Treatments  Labs (all labs ordered are listed, but only abnormal results are displayed) Labs Reviewed  URINALYSIS, ROUTINE W REFLEX MICROSCOPIC - Abnormal; Notable for the following components:      Result Value   APPearance HAZY (*)    All other components within normal limits  SARS CORONAVIRUS 2 BY RT PCR (HOSPITAL ORDER, PERFORMED IN [facility])  GROUP A STREP BY PCR  LIPASE, BLOOD  COMPREHENSIVE METABOLIC PANEL  CBC  POC URINE PREG, ED    EKG None  Radiology No results found.  Procedures Procedures (including critical care time)  Medications Ordered in ED Medications - No data to display  ED Course  I have reviewed the triage vital signs and the nursing notes.  Pertinent labs & imaging results that were available during my care of the patient were reviewed by me and considered in my medical  decision making (see chart for details).    MDM Rules/Calculators/A&P                         MDM:  covid is negative strep is negative.  Pt advised tylenol, drink plenty of fluids,    Final Clinical Impression(s) / ED Diagnoses Final diagnoses:  Pharyngitis, unspecified etiology  Vomiting, intractability of vomiting not specified, presence of nausea not specified, unspecified vomiting type    Rx / DC Orders ED Discharge Orders    None    An After Visit Summary was printed and given to the patient.    Elson Areas, [place] 02/07/20 1854    Bethann Berkshire, MD 02/08/20 1027

## 2020-04-17 ENCOUNTER — Encounter: Payer: Self-pay | Admitting: Adult Health

## 2020-04-17 ENCOUNTER — Ambulatory Visit (INDEPENDENT_AMBULATORY_CARE_PROVIDER_SITE_OTHER): Payer: Medicaid Other | Admitting: Adult Health

## 2020-04-17 ENCOUNTER — Other Ambulatory Visit: Payer: Self-pay

## 2020-04-17 VITALS — BP 104/64 | HR 62 | Ht 62.0 in | Wt 266.0 lb

## 2020-04-17 DIAGNOSIS — Z3201 Encounter for pregnancy test, result positive: Secondary | ICD-10-CM

## 2020-04-17 DIAGNOSIS — Z3A09 9 weeks gestation of pregnancy: Secondary | ICD-10-CM | POA: Diagnosis not present

## 2020-04-17 DIAGNOSIS — O3680X0 Pregnancy with inconclusive fetal viability, not applicable or unspecified: Secondary | ICD-10-CM | POA: Diagnosis not present

## 2020-04-17 DIAGNOSIS — Z98891 History of uterine scar from previous surgery: Secondary | ICD-10-CM

## 2020-04-17 LAB — POCT URINE PREGNANCY: Preg Test, Ur: POSITIVE — AB

## 2020-04-17 MED ORDER — PROMETHAZINE HCL 25 MG PO TABS
25.0000 mg | ORAL_TABLET | Freq: Four times a day (QID) | ORAL | 1 refills | Status: DC | PRN
Start: 2020-04-17 — End: 2020-05-08

## 2020-04-17 NOTE — Progress Notes (Signed)
  Subjective:     Patient ID: Kimberly Houston, female   DOB: [birth year], 24 y.o.   MRN: [id number]  HPI Kimberly Houston is a 24 year old white female,single, in for UPT, has missed  periods and had 2+HPTs. Has nausea.   Review of Systems +missed periods, had +HPTs +nausea +trouble sleeping  Reviewed past medical,surgical, social and family history. Reviewed medications and allergies.     Objective:   Physical Exam BP 104/64 (BP Location: Left Arm, Patient Position: Sitting, Cuff Size: Large)   Pulse 62   Ht 5\' 2"  (1.575 m)   Wt 266 lb (120.7 kg)   LMP 02/09/2020   BMI 48.65 kg/m UPT is +, about 9+5 days by LMP with EDD 11/15/20.Skin warm and dry. Neck: mid line trachea, normal thyroid, good ROM, no lymphadenopathy noted. Lungs: clear to ausculation bilaterally. Cardiovascular: regular rate and rhythm. Abdomen is soft and non tender Fall risk is low AA is 0 PHQ 9 score is 4, no SI  Upstream - 04/17/20 1456      Pregnancy Intention Screening   Does the patient want to become pregnant in the next year? Yes    Does the patient's partner want to become pregnant in the next year? Yes    Would the patient like to discuss contraceptive options today? No      Contraception Wrap Up   Current Method Pregnant/Seeking Pregnancy    End Method Pregnant/Seeking Pregnancy    Contraception Counseling Provided No             Assessment:     1. Pregnancy examination or test, positive result Continue Flintstones 2 daily Continue to decrease smoking   2. [redacted] weeks gestation of pregnancy Will rx phenergan  Meds ordered this encounter  Medications  . promethazine (PHENERGAN) 25 MG tablet    Sig: Take 1 tablet (25 mg total) by mouth every 6 (six) hours as needed for nausea or vomiting.    Dispense:  30 tablet    Refill:  1    Order Specific Question:   Supervising Provider    Answer:   14/01/21, LUTHER H [[id number]]  -sleep in cool room, try milk or warm tea before bed   3. Encounter to determine  fetal viability of pregnancy, single or unspecified fetus Dating Despina Hidden in 1 week   4. History of cesarean section Wants to try for VBAC    Plan:     Review handout by [facility]

## 2020-04-23 ENCOUNTER — Other Ambulatory Visit: Payer: Self-pay

## 2020-04-23 ENCOUNTER — Ambulatory Visit (INDEPENDENT_AMBULATORY_CARE_PROVIDER_SITE_OTHER): Payer: Medicaid Other

## 2020-04-23 DIAGNOSIS — O3680X0 Pregnancy with inconclusive fetal viability, not applicable or unspecified: Secondary | ICD-10-CM | POA: Diagnosis not present

## 2020-04-23 DIAGNOSIS — Z3A10 10 weeks gestation of pregnancy: Secondary | ICD-10-CM | POA: Diagnosis not present

## 2020-04-23 NOTE — Progress Notes (Signed)
[place] 10+4 wks,single IUP,CRL 35.46 mm,fhr 182 bpm,normal ovaries

## 2020-05-02 ENCOUNTER — Other Ambulatory Visit: Payer: Self-pay | Admitting: Obstetrics & Gynecology

## 2020-05-02 DIAGNOSIS — Z3682 Encounter for antenatal screening for nuchal translucency: Secondary | ICD-10-CM

## 2020-05-03 ENCOUNTER — Other Ambulatory Visit: Payer: Medicaid Other

## 2020-05-03 ENCOUNTER — Ambulatory Visit (INDEPENDENT_AMBULATORY_CARE_PROVIDER_SITE_OTHER): Payer: Medicaid Other

## 2020-05-03 ENCOUNTER — Other Ambulatory Visit: Payer: Self-pay

## 2020-05-03 DIAGNOSIS — Z3682 Encounter for antenatal screening for nuchal translucency: Secondary | ICD-10-CM

## 2020-05-03 DIAGNOSIS — Z3480 Encounter for supervision of other normal pregnancy, unspecified trimester: Secondary | ICD-10-CM

## 2020-05-03 DIAGNOSIS — O0990 Supervision of high risk pregnancy, unspecified, unspecified trimester: Secondary | ICD-10-CM | POA: Insufficient documentation

## 2020-05-03 DIAGNOSIS — Z6841 Body Mass Index (BMI) 40.0 and over, adult: Secondary | ICD-10-CM

## 2020-05-03 DIAGNOSIS — Z3A12 12 weeks gestation of pregnancy: Secondary | ICD-10-CM

## 2020-05-03 NOTE — Progress Notes (Signed)
[place] 12 wks,measurements c/w dates,crl 56 mm,normal ovaries,NB present 1.2 mm,fhr 159 bpm,posterior placenta,limited view because of pt body habitus

## 2020-05-06 ENCOUNTER — Ambulatory Visit: Payer: Medicaid Other | Admitting: *Deleted

## 2020-05-06 ENCOUNTER — Encounter: Payer: Self-pay | Admitting: Women's Health

## 2020-05-06 ENCOUNTER — Ambulatory Visit (INDEPENDENT_AMBULATORY_CARE_PROVIDER_SITE_OTHER): Payer: Medicaid Other | Admitting: Women's Health

## 2020-05-06 ENCOUNTER — Other Ambulatory Visit: Payer: Self-pay

## 2020-05-06 ENCOUNTER — Other Ambulatory Visit (HOSPITAL_COMMUNITY)
Admission: RE | Admit: 2020-05-06 | Discharge: 2020-05-06 | Disposition: A | Discharge status: Home / Self Care | Payer: Medicaid Other | Source: Ambulatory Visit | Attending: Obstetrics & Gynecology | Admitting: Obstetrics & Gynecology

## 2020-05-06 VITALS — BP 98/58 | HR 67 | Wt 261.0 lb

## 2020-05-06 DIAGNOSIS — Z1389 Encounter for screening for other disorder: Secondary | ICD-10-CM

## 2020-05-06 DIAGNOSIS — O26899 Other specified pregnancy related conditions, unspecified trimester: Secondary | ICD-10-CM

## 2020-05-06 DIAGNOSIS — Z6791 Unspecified blood type, Rh negative: Secondary | ICD-10-CM

## 2020-05-06 DIAGNOSIS — Z331 Pregnant state, incidental: Secondary | ICD-10-CM

## 2020-05-06 DIAGNOSIS — Z3A12 12 weeks gestation of pregnancy: Secondary | ICD-10-CM | POA: Insufficient documentation

## 2020-05-06 DIAGNOSIS — Z3480 Encounter for supervision of other normal pregnancy, unspecified trimester: Secondary | ICD-10-CM | POA: Insufficient documentation

## 2020-05-06 DIAGNOSIS — Z3481 Encounter for supervision of other normal pregnancy, first trimester: Secondary | ICD-10-CM

## 2020-05-06 DIAGNOSIS — Z8632 Personal history of gestational diabetes: Secondary | ICD-10-CM

## 2020-05-06 DIAGNOSIS — Z8759 Personal history of other complications of pregnancy, childbirth and the puerperium: Secondary | ICD-10-CM | POA: Insufficient documentation

## 2020-05-06 DIAGNOSIS — F418 Other specified anxiety disorders: Secondary | ICD-10-CM

## 2020-05-06 DIAGNOSIS — Z98891 History of uterine scar from previous surgery: Secondary | ICD-10-CM

## 2020-05-06 DIAGNOSIS — F17200 Nicotine dependence, unspecified, uncomplicated: Secondary | ICD-10-CM | POA: Insufficient documentation

## 2020-05-06 LAB — POCT URINALYSIS DIPSTICK OB
Glucose, UA: NEGATIVE
Ketones, UA: NEGATIVE
Nitrite, UA: NEGATIVE
POC,PROTEIN,UA: NEGATIVE

## 2020-05-06 MED ORDER — BLOOD PRESSURE MONITOR MISC: 0 refills | Status: DC

## 2020-05-06 MED ORDER — SERTRALINE HCL 50 MG PO TABS: 50.0000 mg | ORAL_TABLET | Freq: Every day | ORAL | 3 refills | Status: AC

## 2020-05-06 MED ORDER — ASPIRIN 81 MG PO TBEC
162.0000 mg | DELAYED_RELEASE_TABLET | Freq: Every day | ORAL | 2 refills | Status: DC
Start: 2020-05-06 — End: 2020-11-13

## 2020-05-06 NOTE — Progress Notes (Signed)
INITIAL OBSTETRICAL VISIT Patient name: Kimberly Houston MRN [id number]  Date of birth: Mar 20, 1996 Chief Complaint:   Initial Prenatal Visit  History of Present Illness:   Kimberly Houston is a 24 y.o. G56P1001 Caucasian female at [redacted]w[redacted]d by LMP c/w u/s at 10 weeks with an Estimated Date of Delivery: 11/15/20 being seen today for her initial obstetrical visit.   Her obstetrical history is significant for term PLTCS d/t NRFHR & failed IOL for FGR. Also had A2DM and GHTN.   Today she reports h/o dep/anx, on zoloft prior to pregnancy- quit w/ +PT, wants to restart.  Smoker: 1-2ppd prior to pregnancy, now 5-6 cig/day Depression screen [facility] 2/9 05/06/2020 04/17/2020  Decreased Interest 0 0  Down, Depressed, Hopeless 0 0  PHQ - 2 Score 0 0  Altered sleeping 3 2  Tired, decreased energy 1 2  Change in appetite 1 0  Feeling bad or failure about yourself  0 0  Trouble concentrating 3 0  Moving slowly or fidgety/restless 0 0  Suicidal thoughts 0 0  PHQ-9 Score 8 4    Patient's last menstrual period was 02/09/2020. Last pap never. Results were: n/a Review of Systems:   Pertinent items are noted in HPI Denies cramping/contractions, leakage of fluid, vaginal bleeding, abnormal vaginal discharge w/ itching/odor/irritation, headaches, visual changes, shortness of breath, chest pain, abdominal pain, severe nausea/vomiting, or problems with urination or bowel movements unless otherwise stated above.  Pertinent History Reviewed:  Reviewed past medical,surgical, social, obstetrical and family history.  Reviewed problem list, medications and allergies. OB History  Gravida Para Term Preterm AB Living  2 1 1     1   SAB IAB Ectopic Multiple Live Births          1    # Outcome Date GA Lbr Len/2nd Weight Sex Delivery Anes PTL Lv  2 Current           1 Term 04/27/19 [redacted]w[redacted]d  5 lb 7 oz (2.466 kg) F CS-LTranv  N LIV     Complications: IUGR (intrauterine growth restriction) affecting care of mother    Physical Assessment:   Vitals:   05/06/20 0957  BP: (!) 98/58  Pulse: 67  Weight: 261 lb (118.4 kg)  Body mass index is 47.74 kg/m.       Physical Examination:  General appearance - well appearing, and in no distress  Mental status - alert, oriented to person, place, and time  Psych:  She has a normal mood and affect  Skin - warm and dry, normal color, no suspicious lesions noted  Chest - effort normal, all lung fields clear to auscultation bilaterally  Heart - normal rate and regular rhythm  Abdomen - soft, nontender  Extremities:  No swelling or varicosities noted  Pelvic - VULVA: normal appearing vulva with no masses, tenderness or lesions  VAGINA: normal appearing vagina with normal color and discharge, no lesions  CERVIX: normal appearing cervix without discharge or lesions, no CMT  Thin prep pap is done w/ HR HPV cotesting  Chaperone: 05/08/20    TODAY'S FHR: + via informal u/s (couldn't hear w/ doppler)  No results found for this or any previous visit (from the past 24 hour(s)).  Assessment & Plan:  1) Low-Risk Pregnancy G2P1001 at [redacted]w[redacted]d with an Estimated Date of Delivery: 11/15/20   2) Initial OB visit  3) H/O GHTN> baseline labs today, start ASA 162mg   4) H/O A2DM> A1C 4.7 last week  5) Prev c/s> d/t NRFHR &  failed IOL, gave VBAC consent to take home and review  6) Dep/anx> refilled zoloft 50mg  (couldn't remember her dosage, thinks it might have been 100mg , will start w/ 50mg  and go up prn)  7) Smoker> Smokes 5-6/day, counseled x 3-61mins, advised cessation, discussed risks to fetus while pregnant, to infant pp, and to herself. Offered QuitlineNC, declined, wants to try to quit on her own.    Meds:  Meds ordered this encounter  Medications  . Blood Pressure Monitor MISC    Sig: For regular home bp monitoring during pregnancy    Dispense:  1 each    Refill:  0    Z34.81 Needs large cuff  . aspirin 81 MG EC tablet    Sig: Take 2 tablets (162 mg total)  by mouth daily. Swallow whole.    Dispense:  180 tablet    Refill:  2    Order Specific Question:   Supervising Provider    Answer:   H [[id number]]    Initial labs obtained Continue prenatal vitamins Reviewed n/v relief measures and warning s/s to report Reviewed recommended weight gain based on pre-gravid BMI Encouraged well-balanced diet Genetic & carrier screening discussed: requests Panorama, NT/IT and Horizon 14  Ultrasound discussed; fetal survey: requested CCNC completed> form faxed if has or is planning to apply for medicaid The nature of for 9m with multiple MDs and other Advanced Practice Providers was explained to patient; also emphasized that fellows, residents, and students are part of our team. Does not have home bp cuff. Rx faxed to [facility]. Check bp weekly, let Duane Lope know if >140/90.   Indications for early A1C (per uptodate) BMI >=25 (>=23 in Asian women) AND one of the following GDM in a previous pregnancy Yes   Follow-up: Return in about 3 weeks (around 05/27/2020) for LROB, 2nd IT, CNM, in person.   Orders Placed This Encounter  Procedures  . Urine Culture  . Genetic Screening  . Pain Management Screening Profile (10S)  . Comprehensive metabolic panel  . Protein / creatinine ratio, urine  . POC Urinalysis Dipstick OB    [employer] CNM, [facility] 05/06/2020 10:29 AM

## 2020-05-06 NOTE — Patient Instructions (Signed)
Kimberly Houston, I greatly value your feedback.  If you receive a survey following your visit with [place] today, we appreciate you taking the time to fill it out.  Thanks, Joellyn Haff, CNM, WHNP-BC   [facility] ([address] [place], [place] [address]) Entrance C, located off of [address] Free 24/7 valet parking   Nausea & Vomiting  Have saltine crackers or pretzels by your bed and eat a few bites before you raise your head out of bed in the morning  Eat small frequent meals throughout the day instead of large meals  Drink plenty of fluids throughout the day to stay hydrated, just don't drink a lot of fluids with your meals.  This can make your stomach fill up faster making you feel sick  Do not brush your teeth right after you eat  Products with real ginger are good for nausea, like ginger ale and ginger hard candy Make sure it says made with real ginger!  Sucking on sour candy like lemon heads is also good for nausea  If your prenatal vitamins make you nauseated, take them at night so you will sleep through the nausea  Sea Bands  If you feel like you need medicine for the nausea & vomiting please let us know  If you are unable to keep any fluids or food down please let us know   Constipation  Drink plenty of fluid, preferably water, throughout the day  Eat foods high in fiber such as fruits, vegetables, and grains  Exercise, such as walking, is a good way to keep your bowels regular  Drink warm fluids, especially warm prune juice, or decaf coffee  Eat a 1/2 cup of real oatmeal (not instant), 1/2 cup applesauce, and 1/2-1 cup warm prune juice every day  If needed, you may take Colace (docusate sodium) stool softener once or twice a day to help keep the stool soft.   If you still are having problems with constipation, you may take Miralax once daily as needed to help keep your bowels regular.   Home Blood Pressure Monitoring for Patients    Your provider has recommended that you check your blood pressure (BP) at least once a week at home. If you do not have a blood pressure cuff at home, one will be provided for you. Contact your provider if you have not received your monitor within 1 week.   Helpful Tips for Accurate Home Blood Pressure Checks  . Don't smoke, exercise, or drink caffeine 30 minutes before checking your BP . Use the restroom before checking your BP (a full bladder can raise your pressure) . Relax in a comfortable upright chair . Feet on the ground . Left arm resting comfortably on a flat surface at the level of your heart . Legs uncrossed . Back supported . Sit quietly and don't talk . Place the cuff on your bare arm . Adjust snuggly, so that only two fingertips can fit between your skin and the top of the cuff . Check 2 readings separated by at least one minute . Keep a log of your BP readings . For a visual, please reference this diagram: http://ccnc.care/bpdiagram  Provider Name: [facility]     Phone: [phone]  Zone 1: ALL CLEAR  Continue to monitor your symptoms:  . BP reading is less than 140 (top number) or less than 90 (bottom number)  . No right upper stomach pain . No headaches or seeing  spots . No feeling nauseated or throwing up . No swelling in face and hands  Zone 2: CAUTION Call your doctor's office for any of the following:  . BP reading is greater than 140 (top number) or greater than 90 (bottom number)  . Stomach pain under your ribs in the middle or right side . Headaches or seeing spots . Feeling nauseated or throwing up . Swelling in face and hands  Zone 3: EMERGENCY  Seek immediate medical care if you have any of the following:  . BP reading is greater than160 (top number) or greater than 110 (bottom number) . Severe headaches not improving with Tylenol . Serious difficulty catching your breath . Any worsening symptoms from Zone 2    First Trimester of  Pregnancy The first trimester of pregnancy is from week 1 until the end of week 12 (months 1 through 3). A week after a sperm fertilizes an egg, the egg will implant on the wall of the uterus. This embryo will begin to develop into a baby. Genes from you and your partner are forming the baby. The female genes determine whether the baby is a boy or a girl. At 6-8 weeks, the eyes and face are formed, and the heartbeat can be seen on ultrasound. At the end of 12 weeks, all the baby's organs are formed.  Now that you are pregnant, you will want to do everything you can to have a healthy baby. Two of the most important things are to get good prenatal care and to follow your health care provider's instructions. Prenatal care is all the medical care you receive before the baby's birth. This care will help prevent, find, and treat any problems during the pregnancy and childbirth. BODY CHANGES Your body goes through many changes during pregnancy. The changes vary from woman to woman.   You may gain or lose a couple of pounds at first.  You may feel sick to your stomach (nauseous) and throw up (vomit). If the vomiting is uncontrollable, call your health care provider.  You may tire easily.  You may develop headaches that can be relieved by medicines approved by your health care provider.  You may urinate more often. Painful urination may mean you have a bladder infection.  You may develop heartburn as a result of your pregnancy.  You may develop constipation because certain hormones are causing the muscles that push waste through your intestines to slow down.  You may develop hemorrhoids or swollen, bulging veins (varicose veins).  Your breasts may begin to grow larger and become tender. Your nipples may stick out more, and the tissue that surrounds them (areola) may become darker.  Your gums may bleed and may be sensitive to brushing and flossing.  Dark spots or blotches (chloasma, mask of pregnancy)  may develop on your face. This will likely fade after the baby is born.  Your menstrual periods will stop.  You may have a loss of appetite.  You may develop cravings for certain kinds of food.  You may have changes in your emotions from day to day, such as being excited to be pregnant or being concerned that something may go wrong with the pregnancy and baby.  You may have more vivid and strange dreams.  You may have changes in your hair. These can include thickening of your hair, rapid growth, and changes in texture. Some women also have hair loss during or after pregnancy, or hair that feels dry or thin. Your hair  will most likely return to normal after your baby is born. WHAT TO EXPECT AT YOUR PRENATAL VISITS During a routine prenatal visit:  You will be weighed to make sure you and the baby are growing normally.  Your blood pressure will be taken.  Your abdomen will be measured to track your baby's growth.  The fetal heartbeat will be listened to starting around week 10 or 12 of your pregnancy.  Test results from any previous visits will be discussed. Your health care provider may ask you:  How you are feeling.  If you are feeling the baby move.  If you have had any abnormal symptoms, such as leaking fluid, bleeding, severe headaches, or abdominal cramping.  If you have any questions. Other tests that may be performed during your first trimester include:  Blood tests to find your blood type and to check for the presence of any previous infections. They will also be used to check for low iron levels (anemia) and Rh antibodies. Later in the pregnancy, blood tests for diabetes will be done along with other tests if problems develop.  Urine tests to check for infections, diabetes, or protein in the urine.  An ultrasound to confirm the proper growth and development of the baby.  An amniocentesis to check for possible genetic problems.  Fetal screens for spina bifida and  Down syndrome.  You may need other tests to make sure you and the baby are doing well. HOME CARE INSTRUCTIONS  Medicines  Follow your health care provider's instructions regarding medicine use. Specific medicines may be either safe or unsafe to take during pregnancy.  Take your prenatal vitamins as directed.  If you develop constipation, try taking a stool softener if your health care provider approves. Diet  Eat regular, well-balanced meals. Choose a variety of foods, such as meat or vegetable-based protein, fish, milk and low-fat dairy products, vegetables, fruits, and whole grain breads and cereals. Your health care provider will help you determine the amount of weight gain that is right for you.  Avoid raw meat and uncooked cheese. These carry germs that can cause birth defects in the baby.  Eating four or five small meals rather than three large meals a day may help relieve nausea and vomiting. If you start to feel nauseous, eating a few soda crackers can be helpful. Drinking liquids between meals instead of during meals also seems to help nausea and vomiting.  If you develop constipation, eat more high-fiber foods, such as fresh vegetables or fruit and whole grains. Drink enough fluids to keep your urine clear or pale yellow. Activity and Exercise  Exercise only as directed by your health care provider. Exercising will help you:  Control your weight.  Stay in shape.  Be prepared for labor and delivery.  Experiencing pain or cramping in the lower abdomen or low back is a good sign that you should stop exercising. Check with your health care provider before continuing normal exercises.  Try to avoid standing for long periods of time. Move your legs often if you must stand in one place for a long time.  Avoid heavy lifting.  Wear low-heeled shoes, and practice good posture.  You may continue to have sex unless your health care provider directs you otherwise. Relief of Pain  or Discomfort  Wear a good support bra for breast tenderness.    Take warm sitz baths to soothe any pain or discomfort caused by hemorrhoids. Use hemorrhoid cream if your health care provider approves.  Rest with your legs elevated if you have leg cramps or low back pain.  If you develop varicose veins in your legs, wear support hose. Elevate your feet for 15 minutes, 3-4 times a day. Limit salt in your diet. Prenatal Care  Schedule your prenatal visits by the twelfth week of pregnancy. They are usually scheduled monthly at first, then more often in the last 2 months before delivery.  Write down your questions. Take them to your prenatal visits.  Keep all your prenatal visits as directed by your health care provider. Safety  Wear your seat belt at all times when driving.  Make a list of emergency phone numbers, including numbers for family, friends, the hospital, and police and fire departments. General Tips  Ask your health care provider for a referral to a local prenatal education class. Begin classes no later than at the beginning of month 6 of your pregnancy.  Ask for help if you have counseling or nutritional needs during pregnancy. Your health care provider can offer advice or refer you to specialists for help with various needs.  Do not use hot tubs, steam rooms, or saunas.  Do not douche or use tampons or scented sanitary pads.  Do not cross your legs for long periods of time.  Avoid cat litter boxes and soil used by cats. These carry germs that can cause birth defects in the baby and possibly loss of the fetus by miscarriage or stillbirth.  Avoid all smoking, herbs, alcohol, and medicines not prescribed by your health care provider. Chemicals in these affect the formation and growth of the baby.  Schedule a dentist appointment. At home, brush your teeth with a soft toothbrush and be gentle when you floss. SEEK MEDICAL CARE IF:   You have dizziness.  You have mild  pelvic cramps, pelvic pressure, or nagging pain in the abdominal area.  You have persistent nausea, vomiting, or diarrhea.  You have a bad smelling vaginal discharge.  You have pain with urination.  You notice increased swelling in your face, hands, legs, or ankles. SEEK IMMEDIATE MEDICAL CARE IF:   You have a fever.  You are leaking fluid from your vagina.  You have spotting or bleeding from your vagina.  You have severe abdominal cramping or pain.  You have rapid weight gain or loss.  You vomit blood or material that looks like coffee grounds.  You are exposed to [place] measles and have never had them.  You are exposed to fifth disease or chickenpox.  You develop a severe headache.  You have shortness of breath.  You have any kind of trauma, such as from a fall or a car accident. Document Released: 04/28/2001 Document Revised: 09/18/2013 Document Reviewed: 03/14/2013 [facility] Patient Information 2015 [place], [place]. This information is not intended to replace advice given to you by your health care provider. Make sure you discuss any questions you have with your health care provider.

## 2020-05-07 LAB — COMPREHENSIVE METABOLIC PANEL WITH GFR
ALT: 12 IU/L (ref 0–32)
AST: 13 IU/L (ref 0–40)
Albumin/Globulin Ratio: 1.5 (ref 1.2–2.2)
Albumin: 4 g/dL (ref 3.9–5.0)
Alkaline Phosphatase: 60 IU/L (ref 44–121)
BUN/Creatinine Ratio: 12 (ref 9–23)
BUN: 8 mg/dL (ref 6–20)
Bilirubin Total: 0.6 mg/dL (ref 0.0–1.2)
CO2: 21 mmol/L (ref 20–29)
Calcium: 9.3 mg/dL (ref 8.7–10.2)
Chloride: 102 mmol/L (ref 96–106)
Creatinine, Ser: 0.67 mg/dL (ref 0.57–1.00)
GFR calc Af Amer: 144 mL/min/1.73
GFR calc non Af Amer: 125 mL/min/1.73
Globulin, Total: 2.7 g/dL (ref 1.5–4.5)
Glucose: 80 mg/dL (ref 65–99)
Potassium: 4.6 mmol/L (ref 3.5–5.2)
Sodium: 136 mmol/L (ref 134–144)
Total Protein: 6.7 g/dL (ref 6.0–8.5)

## 2020-05-07 LAB — INTEGRATED 1
Crown Rump Length: 56 mm
Gest. Age on Collection Date: 12 weeks
Maternal Age at EDD: 22.6 yr
Nuchal Translucency (NT): 1.2 mm
Number of Fetuses: 1
PAPP-A Value: 183.7 ng/mL
Weight: 264 [lb_av]

## 2020-05-07 LAB — CBC/D/PLT+RPR+RH+ABO+RUB AB...
Antibody Screen: NEGATIVE
Basophils Absolute: 0 x10E3/uL (ref 0.0–0.2)
Basos: 0 %
EOS (ABSOLUTE): 0.1 x10E3/uL (ref 0.0–0.4)
Eos: 1 %
HCV Ab: <0.1 {s_co_ratio} (ref 0.0–0.9)
HIV Screen 4th Generation wRfx: NONREACTIVE
Hematocrit: 36.7 % (ref 34.0–46.6)
Hemoglobin: 12.1 g/dL (ref 11.1–15.9)
Hepatitis B Surface Ag: NEGATIVE
Immature Grans (Abs): 0 x10E3/uL (ref 0.0–0.1)
Immature Granulocytes: 0 %
Lymphocytes Absolute: 1.7 x10E3/uL (ref 0.7–3.1)
Lymphs: 25 %
MCH: 30 pg (ref 26.6–33.0)
MCHC: 33 g/dL (ref 31.5–35.7)
MCV: 91 fL (ref 79–97)
Monocytes Absolute: 0.5 x10E3/uL (ref 0.1–0.9)
Monocytes: 7 %
Neutrophils Absolute: 4.5 x10E3/uL (ref 1.4–7.0)
Neutrophils: 67 %
Platelets: 297 x10E3/uL (ref 150–450)
RBC: 4.03 x10E6/uL (ref 3.77–5.28)
RDW: 13.4 % (ref 11.7–15.4)
RPR Ser Ql: REACTIVE — AB
Rh Factor: NEGATIVE
Rubella Antibodies, IGG: 1.19 {index}
WBC: 6.8 x10E3/uL (ref 3.4–10.8)

## 2020-05-07 LAB — RPR, QUANT+TP ABS (REFLEX)
Rapid Plasma Reagin, Quant: 1:2 {titer} — ABNORMAL HIGH
T Pallidum Abs: NONREACTIVE

## 2020-05-07 LAB — HEMOGLOBIN A1C
Est. average glucose Bld gHb Est-mCnc: 88 mg/dL
Hgb A1c MFr Bld: 4.7 % — ABNORMAL LOW (ref 4.8–5.6)

## 2020-05-07 LAB — PROTEIN / CREATININE RATIO, URINE
Creatinine, Urine: 163.3 mg/dL
Protein, Ur: 9.9 mg/dL
Protein/Creat Ratio: 61 mg/g creat (ref 0–200)

## 2020-05-07 LAB — HCV INTERPRETATION

## 2020-05-08 ENCOUNTER — Encounter: Payer: Self-pay | Admitting: Women's Health

## 2020-05-08 ENCOUNTER — Other Ambulatory Visit: Payer: Self-pay | Admitting: Women's Health

## 2020-05-08 DIAGNOSIS — R87619 Unspecified abnormal cytological findings in specimens from cervix uteri: Secondary | ICD-10-CM | POA: Insufficient documentation

## 2020-05-08 LAB — CYTOLOGY - PAP
Chlamydia: NEGATIVE
Comment: NEGATIVE
Comment: NORMAL
Neisseria Gonorrhea: NEGATIVE

## 2020-05-08 MED ORDER — PROMETHAZINE HCL 25 MG PO TABS: 25.0000 mg | ORAL_TABLET | Freq: Four times a day (QID) | ORAL | 1 refills | Status: DC | PRN

## 2020-05-22 ENCOUNTER — Emergency Department (HOSPITAL_COMMUNITY)
Admission: EM | Admit: 2020-05-22 | Discharge: 2020-05-22 | Disposition: A | Discharge status: Home / Self Care | Payer: Medicaid Other | Attending: Emergency Medicine | Admitting: Emergency Medicine

## 2020-05-22 ENCOUNTER — Encounter (HOSPITAL_COMMUNITY): Payer: Self-pay | Admitting: Emergency Medicine

## 2020-05-22 ENCOUNTER — Encounter: Payer: Self-pay | Admitting: *Deleted

## 2020-05-22 ENCOUNTER — Other Ambulatory Visit: Payer: Self-pay

## 2020-05-22 DIAGNOSIS — U071 COVID-19: Secondary | ICD-10-CM | POA: Insufficient documentation

## 2020-05-22 DIAGNOSIS — Z79899 Other long term (current) drug therapy: Secondary | ICD-10-CM | POA: Insufficient documentation

## 2020-05-22 DIAGNOSIS — F17210 Nicotine dependence, cigarettes, uncomplicated: Secondary | ICD-10-CM | POA: Insufficient documentation

## 2020-05-22 DIAGNOSIS — Z3480 Encounter for supervision of other normal pregnancy, unspecified trimester: Secondary | ICD-10-CM

## 2020-05-22 DIAGNOSIS — Z7982 Long term (current) use of aspirin: Secondary | ICD-10-CM | POA: Diagnosis not present

## 2020-05-22 DIAGNOSIS — Z91040 Latex allergy status: Secondary | ICD-10-CM | POA: Diagnosis not present

## 2020-05-22 DIAGNOSIS — H6692 Otitis media, unspecified, left ear: Secondary | ICD-10-CM | POA: Insufficient documentation

## 2020-05-22 DIAGNOSIS — Z20822 Contact with and (suspected) exposure to covid-19: Secondary | ICD-10-CM

## 2020-05-22 DIAGNOSIS — R0981 Nasal congestion: Secondary | ICD-10-CM | POA: Diagnosis present

## 2020-05-22 DIAGNOSIS — J45909 Unspecified asthma, uncomplicated: Secondary | ICD-10-CM | POA: Diagnosis not present

## 2020-05-22 MED ORDER — CEFDINIR 300 MG PO CAPS: 600.0000 mg | ORAL_CAPSULE | Freq: Every day | ORAL | 0 refills | Status: AC

## 2020-05-22 NOTE — Discharge Instructions (Addendum)
Your covid test will result in the next 24 hours. You can follow up on the results on your mychart.   You were given a prescription for antibiotics. Please take the antibiotic prescription fully.   Please follow up with your primary care provider within 5-7 days for re-evaluation of your symptoms. If you do not have a primary care provider, information for a healthcare clinic has been provided for you to make arrangements for follow up care. Please return to the emergency department for any new or worsening symptoms.

## 2020-05-22 NOTE — ED Provider Notes (Signed)
[facility] EMERGENCY DEPARTMENT Provider Note   CSN: [id number] Arrival date & time: 05/22/20  1539     History Chief Complaint  Patient presents with  . Sore Throat    Kimberly Houston is a 25 y.o. female.  HPI   25 year old female with a history of anxiety, asthma, depression, who presents to the emergency department today for evaluation of URI symptoms.  Patient lives in a household where multiple family members just tested positive for Covid including her 68-month-old daughter is present at bedside.  She states that for the last several days she has had some nasal congestion, sore throat, left ear pain and cough.  She is currently [redacted]w[redacted]d pregnant by LMP. Has confirmed IUP. Reports nausea 2/2 her pregnancy.   Past Medical History:  Diagnosis Date  . Anxiety   . Asthma   . Depression     Patient Active Problem List   Diagnosis Date Noted  . Abnormal Pap smear of cervix 05/08/2020  . History of gestational hypertension 05/06/2020  . History of gestational diabetes 05/06/2020  . Smoker 05/06/2020  . Depression with anxiety 05/06/2020  . Encounter for supervision of normal pregnancy, antepartum 05/03/2020  . History of cesarean section 04/17/2020  . Rh negative state in antepartum period 10/06/2018  . 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 Eustachian tube dysfunction, bilateral 09/17/2015  . Attention deficit disorder 01/19/2014    Past Surgical History:  Procedure Laterality Date  . c section    . CESAREAN SECTION    . TYMPANOSTOMY TUBE PLACEMENT  2018     OB History    Gravida  2   Para  1   Term  1   Preterm      AB      Living  1     SAB      IAB      Ectopic      Multiple      Live Births  1           Family History  Problem Relation Age of Onset  . Heart attack Paternal Grandfather   . COPD Paternal Grandmother   . Asthma Paternal Grandmother   . Hypertension Maternal Grandfather   . Diabetes Maternal Grandfather    . Seizures Father   . Alcohol abuse Father   . Other Mother        deg. disc disease    Social History   Tobacco Use  . Smoking status: Current Every Day Smoker    Packs/day: 0.50    Years: 6.00    Pack years: 3.00    Types: Cigarettes  . Smokeless tobacco: Never Used  . Tobacco comment: smokes 1 cig daily  Vaping Use  . Vaping Use: Some days  Substance Use Topics  . Alcohol use: Not Currently  . Drug use: Not Currently    Home Medications Prior to Admission medications   Medication Sig Start Date End Date Taking? Authorizing Provider  cefdinir (OMNICEF) 300 MG capsule Take 2 capsules (600 mg total) by mouth daily for 7 days. 05/22/20 05/29/20 Yes Terin Cragle S, PA-C  albuterol (VENTOLIN HFA) 108 (90 Base) MCG/ACT inhaler Inhale 2 puffs into the lungs every 6 (six) hours as needed for wheezing or shortness of breath.    [provider]  aspirin 81 MG EC tablet Take 2 tablets (162 mg total) by mouth daily. Swallow whole. 05/06/20   Cheral Marker, CNM  Blood Pressure Monitor MISC For  regular home bp monitoring during pregnancy 05/06/20   Roma Schanz, CNM  hydrOXYzine (ATARAX/VISTARIL) 25 MG tablet Take 25 mg by mouth daily as needed for itching (allergies).  Patient not taking: Reported on 05/06/2020 09/26/19   [provider]  multivitamin (VIT W/EXTRA C) CHEW chewable tablet Chew 12 tablets by mouth daily.    [provider]  promethazine (PHENERGAN) 25 MG tablet Take 1 tablet (25 mg total) by mouth every 6 (six) hours as needed for nausea or vomiting. 05/08/20   Roma Schanz, CNM  sertraline (ZOLOFT) 50 MG tablet Take 1 tablet (50 mg total) by mouth daily. 05/06/20   Roma Schanz, CNM    Allergies    Amoxicillin, Drug ingredient [azithromycin], Erythromycin, Gentamicin, Metronidazole, Penicillins, and Latex  Review of Systems   Review of Systems  Constitutional: Positive for chills.  HENT: Positive for congestion, ear  pain, rhinorrhea and sore throat.   Eyes: Negative for visual disturbance.  Respiratory: Positive for cough.   Cardiovascular: Negative for chest pain.  Gastrointestinal: Positive for diarrhea and nausea. Negative for abdominal pain.  Genitourinary: Negative for dysuria and hematuria.  Musculoskeletal: Negative for back pain.  Skin: Negative for rash.  Neurological: Negative for headaches.  All other systems reviewed and are negative.   Physical Exam Updated Vital Signs BP 121/78 (BP Location: Right Arm)   Pulse 71   Temp 98 F (36.7 C) (Oral)   Resp 18   Wt 115.6 kg   LMP 02/09/2020   SpO2 100%   BMI 46.62 kg/m   Physical Exam Vitals and nursing note reviewed.  Constitutional:      General: She is not in acute distress.    Appearance: She is well-developed and well-nourished.  HENT:     Head: Normocephalic and atraumatic.     Right Ear: Tympanic membrane normal.     Left Ear: Tympanic membrane is erythematous.  Eyes:     Conjunctiva/sclera: Conjunctivae normal.  Cardiovascular:     Rate and Rhythm: Normal rate and regular rhythm.     Heart sounds: Normal heart sounds. No murmur heard.   Pulmonary:     Effort: Pulmonary effort is normal. No respiratory distress.     Breath sounds: Normal breath sounds.  Abdominal:     General: Bowel sounds are normal.     Palpations: Abdomen is soft.     Tenderness: There is no abdominal tenderness.  Musculoskeletal:        General: No edema.     Cervical back: Neck supple.  Skin:    General: Skin is warm and dry.  Neurological:     Mental Status: She is alert.  Psychiatric:        Mood and Affect: Mood and affect normal.     ED Results / Procedures / Treatments   Labs (all labs ordered are listed, but only abnormal results are displayed) Labs Reviewed  SARS CORONAVIRUS 2 (TAT 6-24 HRS)    EKG None  Radiology No results found.  Procedures Procedures (including critical care time)  Medications Ordered in  ED Medications - No data to display  ED Course  I have reviewed the triage vital signs and the nursing notes.  Pertinent labs & imaging results that were available during my care of the patient were reviewed by me and considered in my medical decision making (see chart for details).    MDM Rules/Calculators/A&P  25 year old female that is currently 14 weeks 2 days pregnant presenting for evaluation of Covid symptoms.  Lives with multiple family members who tested positive for Covid including her daughter who is present at bedside.  She has had URI symptoms for the last several days including a cough.  She also states she is prone to ear infections and has some left ear pain.  Left TM is erythematous and bulging on exam.  Remainder of exam is benign.  Vital signs reassuring.  Fetal heart tones completed and were 140BPM.  Given frequent history of bacterial otitis media will treat with antibiotics discussed that this may likely be viral if she has Covid.  She would like to proceed with antibiotic therapy. Advised her to f/u with pcp and obgyn in regards to dx and reassessment.  Advised on specific return precautions.  She voices understanding the plan and reasons to return.  All questions answered.  Patient stable for discharge  Kimberly Houston was evaluated in Emergency Department on 05/22/2020 for the symptoms described in the history of present illness. She was evaluated in the context of the global COVID-19 pandemic, which necessitated consideration that the patient might be at risk for infection with the SARS-CoV-2 virus that causes COVID-19. Institutional protocols and algorithms that pertain to the evaluation of patients at risk for COVID-19 are in a state of rapid change based on information released by regulatory bodies including the CDC and federal and state organizations. These policies and algorithms were followed during the patient's care in the ED.   Final  Clinical Impression(s) / ED Diagnoses Final diagnoses:  Suspected COVID-19 virus infection  Left otitis media, unspecified otitis media type    Rx / DC Orders ED Discharge Orders         Ordered    cefdinir (OMNICEF) 300 MG capsule  Daily        05/22/20 1811           Karrie Meres, PA-C 05/22/20 1815    Cathren Laine, MD 05/22/20 1835

## 2020-05-22 NOTE — ED Triage Notes (Signed)
Pt was exposed to Covid and c/o sore throat n/v/d/f.

## 2020-05-23 ENCOUNTER — Telehealth: Payer: Self-pay | Admitting: Women's Health

## 2020-05-23 LAB — SARS CORONAVIRUS 2 (TAT 6-24 HRS): SARS Coronavirus 2: POSITIVE — AB

## 2020-05-23 NOTE — Telephone Encounter (Signed)
Patient called to reschedule 05/28/20  appt due to testing positive for covid and wants a call back from a nurse to see if she needs to go to the Hospital. Clinical staff will follow up with patient.

## 2020-05-23 NOTE — Telephone Encounter (Signed)
Spoke with patient and advised home care. She is not in any distress. Advised use of tylenol for fever and plenty of fluids. No other questions at this time.

## 2020-05-25 ENCOUNTER — Encounter: Payer: Self-pay | Admitting: Oncology

## 2020-05-25 ENCOUNTER — Telehealth: Payer: Self-pay | Admitting: Oncology

## 2020-05-25 NOTE — Telephone Encounter (Signed)
Called to discuss with patient about COVID-19 symptoms and the use of one of the available treatments for those with mild to moderate Covid symptoms and at a high risk of hospitalization.  Pt appears to qualify for outpatient treatment due to co-morbid conditions and/or a member of an at-risk group in accordance with the FDA Emergency Use Authorization.    Symptom onset: unclear (05/20/20 ?????) Vaccinated: no  Booster? no Qualifiers: asthma, obesity, pregnancy  Unable to reach pt - Left vm and mcm  Would qualify for mab only.   Kimberly Houston

## 2020-05-28 ENCOUNTER — Encounter: Payer: Medicaid Other | Admitting: Women's Health

## 2020-06-04 ENCOUNTER — Other Ambulatory Visit: Payer: Self-pay

## 2020-06-04 ENCOUNTER — Encounter (HOSPITAL_COMMUNITY): Payer: Self-pay | Admitting: *Deleted

## 2020-06-04 ENCOUNTER — Emergency Department (HOSPITAL_COMMUNITY): Payer: Medicaid Other

## 2020-06-04 DIAGNOSIS — J45909 Unspecified asthma, uncomplicated: Secondary | ICD-10-CM | POA: Diagnosis not present

## 2020-06-04 DIAGNOSIS — R072 Precordial pain: Secondary | ICD-10-CM | POA: Diagnosis not present

## 2020-06-04 DIAGNOSIS — Z91040 Latex allergy status: Secondary | ICD-10-CM | POA: Diagnosis not present

## 2020-06-04 DIAGNOSIS — R110 Nausea: Secondary | ICD-10-CM | POA: Insufficient documentation

## 2020-06-04 DIAGNOSIS — R0602 Shortness of breath: Secondary | ICD-10-CM | POA: Insufficient documentation

## 2020-06-04 DIAGNOSIS — F17210 Nicotine dependence, cigarettes, uncomplicated: Secondary | ICD-10-CM | POA: Diagnosis not present

## 2020-06-04 DIAGNOSIS — Z7982 Long term (current) use of aspirin: Secondary | ICD-10-CM | POA: Diagnosis not present

## 2020-06-04 NOTE — ED Triage Notes (Signed)
Pt with chest pressure since last night, dizzy spells off and on throughout the dx on 1/11

## 2020-06-04 NOTE — ED Notes (Signed)
Pt states she slipped on ice this am and fell, c/o left knee pain.

## 2020-06-05 ENCOUNTER — Encounter: Payer: Self-pay | Admitting: *Deleted

## 2020-06-05 ENCOUNTER — Emergency Department (HOSPITAL_COMMUNITY)
Admission: EM | Admit: 2020-06-05 | Discharge: 2020-06-05 | Disposition: A | Discharge status: Home / Self Care | Payer: Medicaid Other | Attending: Emergency Medicine | Admitting: Emergency Medicine

## 2020-06-05 DIAGNOSIS — R079 Chest pain, unspecified: Secondary | ICD-10-CM

## 2020-06-05 NOTE — ED Provider Notes (Signed)
[facility] EMERGENCY DEPARTMENT Provider Note   CSN: [id number] Arrival date & time: 06/04/20  1950     History Chief Complaint  Patient presents with  . Chest Pain    Kimberly Houston is a 25 y.o. female.   Chest Pain Pain location:  Substernal area, L chest and R chest Pain quality: aching and pressure   Pain radiates to:  Does not radiate Pain severity:  Mild Onset quality:  Gradual Timing:  Intermittent Progression:  Waxing and waning Chronicity:  New Context: breathing   Context: not intercourse   Relieved by:  None tried Worsened by:  Nothing Ineffective treatments:  None tried Associated symptoms: nausea and shortness of breath   Associated symptoms: no abdominal pain, no altered mental status, no back pain, no claudication, no cough, no dizziness, no dysphagia, no fatigue, no fever, no orthopnea, no palpitations and no weakness        Past Medical History:  Diagnosis Date  . Anxiety   . Asthma   . Depression     Patient Active Problem List   Diagnosis Date Noted  . Abnormal Pap smear of cervix 05/08/2020  . History of gestational hypertension 05/06/2020  . History of gestational diabetes 05/06/2020  . Smoker 05/06/2020  . Depression with anxiety 05/06/2020  . Encounter for supervision of normal pregnancy, antepartum 05/03/2020  . History of cesarean section 04/17/2020  . Rh negative state in antepartum period 10/06/2018  . 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 Eustachian tube dysfunction, bilateral 09/17/2015  . Attention deficit disorder 01/19/2014    Past Surgical History:  Procedure Laterality Date  . c section    . CESAREAN SECTION    . TYMPANOSTOMY TUBE PLACEMENT  2018     OB History    Gravida  2   Para  1   Term  1   Preterm      AB      Living  1     SAB      IAB      Ectopic      Multiple      Live Births  1           Family History  Problem Relation Age of Onset  . Heart attack Paternal  Grandfather   . COPD Paternal Grandmother   . Asthma Paternal Grandmother   . Hypertension Maternal Grandfather   . Diabetes Maternal Grandfather   . Seizures Father   . Alcohol abuse Father   . Other Mother        deg. disc disease    Social History   Tobacco Use  . Smoking status: Current Every Day Smoker    Packs/day: 0.50    Years: 6.00    Pack years: 3.00    Types: Cigarettes  . Smokeless tobacco: Never Used  . Tobacco comment: smokes 1 cig daily  Vaping Use  . Vaping Use: Some days  Substance Use Topics  . Alcohol use: Not Currently  . Drug use: Not Currently    Home Medications Prior to Admission medications   Medication Sig Start Date End Date Taking? Authorizing Provider  albuterol (VENTOLIN HFA) 108 (90 Base) MCG/ACT inhaler Inhale 2 puffs into the lungs every 6 (six) hours as needed for wheezing or shortness of breath.    [provider]  aspirin 81 MG EC tablet Take 2 tablets (162 mg total) by mouth daily. Swallow whole. 05/06/20   Cheral Marker, CNM  Blood  Pressure Monitor MISC For regular home bp monitoring during pregnancy 05/06/20   Cheral Marker, CNM  hydrOXYzine (ATARAX/VISTARIL) 25 MG tablet Take 25 mg by mouth daily as needed for itching (allergies).  Patient not taking: Reported on 05/06/2020 09/26/19   [provider]  multivitamin (VIT W/EXTRA C) CHEW chewable tablet Chew 12 tablets by mouth daily.    [provider]  promethazine (PHENERGAN) 25 MG tablet Take 1 tablet (25 mg total) by mouth every 6 (six) hours as needed for nausea or vomiting. 05/08/20   Cheral Marker, CNM  sertraline (ZOLOFT) 50 MG tablet Take 1 tablet (50 mg total) by mouth daily. 05/06/20   Cheral Marker, CNM    Allergies    Amoxicillin, Drug ingredient [azithromycin], Erythromycin, Gentamicin, Metronidazole, Penicillins, and Latex  Review of Systems   Review of Systems  Constitutional: Negative for fatigue and fever.  HENT:  Negative for trouble swallowing.   Respiratory: Positive for shortness of breath. Negative for cough.   Cardiovascular: Positive for chest pain. Negative for palpitations, orthopnea and claudication.  Gastrointestinal: Positive for nausea. Negative for abdominal pain.  Musculoskeletal: Negative for back pain.  Neurological: Negative for dizziness and weakness.  All other systems reviewed and are negative.   Physical Exam Updated Vital Signs BP (!) 124/46 (BP Location: Left Arm)   Pulse 80   Temp 98.1 F (36.7 C) (Oral)   Resp 18   Ht 5\' 2"  (1.575 m)   Wt 116.5 kg   LMP 02/09/2020   SpO2 100%   BMI 46.99 kg/m   Physical Exam Vitals and nursing note reviewed.  Constitutional:      Appearance: She is well-developed and well-nourished.  HENT:     Head: Normocephalic and atraumatic.     Mouth/Throat:     Mouth: Mucous membranes are moist.  Eyes:     Pupils: Pupils are equal, round, and reactive to light.  Cardiovascular:     Rate and Rhythm: Normal rate and regular rhythm.  Pulmonary:     Effort: No respiratory distress.     Breath sounds: No stridor.  Abdominal:     General: Abdomen is flat. There is no distension.  Musculoskeletal:     Cervical back: Normal range of motion.  Skin:    General: Skin is warm and dry.  Neurological:     General: No focal deficit present.     Mental Status: She is alert.     ED Results / Procedures / Treatments   Labs (all labs ordered are listed, but only abnormal results are displayed) Labs Reviewed - No data to display  EKG EKG Interpretation  Date/Time:  Tuesday June 04 2020 20:04:58 EST Ventricular Rate:  70 PR Interval:  140 QRS Duration: 88 QT Interval:  380 QTC Calculation: 410 R Axis:   47 Text Interpretation: Normal sinus rhythm Normal ECG No old tracing to compare Confirmed by 02-01-1996 [id number]) on 06/05/2020 1:23:55 AM   Radiology DG Chest Portable 1 View  Result Date: 06/04/2020 CLINICAL DATA:  Status  post fall with generalized chest pressure with dizziness. EXAM: PORTABLE CHEST 1 VIEW COMPARISON:  None. FINDINGS: The heart size and mediastinal contours are within normal limits. Both lungs are clear. The visualized skeletal structures are unremarkable. IMPRESSION: No active disease. Electronically Signed   By: 06/06/2020 M.D.   On: 06/04/2020 21:10   DG Knee Complete 4 Views Left  Result Date: 06/04/2020 CLINICAL DATA:  Status post fall. EXAM: LEFT KNEE -  COMPLETE 4+ VIEW COMPARISON:  August 26, 2019 FINDINGS: No evidence of fracture, dislocation, or joint effusion. No evidence of arthropathy or other focal bone abnormality. Soft tissues are unremarkable. IMPRESSION: Negative. Electronically Signed   By: Aram Candela M.D.   On: 06/04/2020 21:11    Procedures Procedures (including critical care time)  Medications Ordered in ED Medications - No data to display  ED Course  I have reviewed the triage vital signs and the nursing notes.  Pertinent labs & imaging results that were available during my care of the patient were reviewed by me and considered in my medical decision making (see chart for details).    MDM Rules/Calculators/A&P                          Unclear etiology for symptoms. No e/o PE. No e/o acs. No e/o pneumonia. Stable for discharge at this time.   Final Clinical Impression(s) / ED Diagnoses Final diagnoses:  Nonspecific chest pain    Rx / DC Orders ED Discharge Orders    None       Lydian Chavous, Barbara Cower, MD 06/05/20 [phone]

## 2020-06-11 ENCOUNTER — Encounter: Payer: Medicaid Other | Admitting: Women's Health

## 2020-06-12 ENCOUNTER — Ambulatory Visit (INDEPENDENT_AMBULATORY_CARE_PROVIDER_SITE_OTHER): Payer: Medicaid Other | Admitting: Obstetrics and Gynecology

## 2020-06-12 ENCOUNTER — Other Ambulatory Visit: Payer: Self-pay

## 2020-06-12 VITALS — BP 109/69 | HR 88 | Wt 255.0 lb

## 2020-06-12 DIAGNOSIS — O2612 Low weight gain in pregnancy, second trimester: Secondary | ICD-10-CM

## 2020-06-12 DIAGNOSIS — O219 Vomiting of pregnancy, unspecified: Secondary | ICD-10-CM

## 2020-06-12 DIAGNOSIS — Z3482 Encounter for supervision of other normal pregnancy, second trimester: Secondary | ICD-10-CM

## 2020-06-12 DIAGNOSIS — Z3A17 17 weeks gestation of pregnancy: Secondary | ICD-10-CM

## 2020-06-12 MED ORDER — PEDIASURE GROW & GAIN PO LIQD: 1.0000 | Freq: Two times a day (BID) | ORAL | 2 refills | Status: DC

## 2020-06-12 MED ORDER — PROMETHAZINE HCL 25 MG PO TABS: 25.0000 mg | ORAL_TABLET | Freq: Four times a day (QID) | ORAL | 1 refills | Status: DC | PRN

## 2020-06-12 NOTE — Patient Instructions (Signed)
Prenatal Ultrasound A prenatal ultrasound, also called a sonogram, is an imaging test that allows your health care provider to see your baby and placenta in the uterus. This is a safe and painless test that does not expose you or your baby to any X-rays, needles, or medicines. Prenatal ultrasounds are done using a handheld device called a transducer. The transducer sends out sound waves (ultrasound) that reflect off of your baby's bones and other tissues to create images on a computer screen. You may have other ultrasounds as needed at any point during your pregnancy. If your health care provider suspects a problem, you may have a more detailed type of ultrasound called advanced ultrasound. Many insurance plans only cover ultrasounds that are needed for medical care. Check with your health care provider and insurance company to determine if the ultrasound is covered by insurance, or if there will be out of pocket costs to you. Types of prenatal ultrasound There are two types of prenatal ultrasound:  Transabdominal ultrasound. During this test, a transducer is placed on your belly and moved around. A routine transabdominal ultrasound is usually done between weeks 18 and 22 of pregnancy. It may also be done between weeks 13 and 14.  Transvaginal ultrasound. During this test, a transducer that is shaped like a wand is placed inside your vagina. This type of ultrasound is usually done during early pregnancy. What are the benefits of prenatal ultrasound? Prenatal ultrasounds may be used to check:  How far along your pregnancy is and if you are carrying more than one baby.  Your baby's approximate size, weight, and development (gestational age).  The location and condition of the placenta. The placenta supplies your baby with nutrition and oxygen.  Your baby's heart rate, position, and movements.  The amount of fluid surrounding your baby (amniotic fluid).  Your baby's sex, if your baby is in a  position that allows the sex organs to be seen, and if you choose to learn the sex at this time.  If there are any possible problems that require more testing, such as genetic problems.  If your pregnancy is forming outside your uterus (ectopic pregnancy). What are the risks? Generally, this is a safe test. There are no known risks for you or your baby from a prenatal ultrasound. What happens before the test?  Before a transabdominal ultrasound, you may be asked to drink fluid 2 hours before the exam and avoid emptying your bladder. A full bladder helps the images show up more clearly.  Before a transvaginal ultrasound, you may be asked to empty your bladder.  Wear loose, comfortable clothing so it is easy to undress or expose your lower belly for the exam. What happens during the test? If you are having a transabdominal ultrasound:  You will lie on an exam table.  Your belly will be exposed.  Gel will be rubbed over your belly.  The transducer will be pressed on your belly and moved back and forth, through the gel. You may feel slight pressure, but there should not be any pain.  You may be asked to change your position.  You may hear sounds of blood flow and your baby's heartbeat. You may be able to see images of your baby on the computer screen. Your health care provider may measure your baby's head and other body parts, looking for normal development.  After the exam, the gel will be cleaned off, and you can replace your clothing. You will be able to empty   your bladder after the exam is done. If you are having a transvaginal ultrasound:  You will change into a hospital gown or undress from the waist down and cover yourself with a paper sheet.  You will lie down on an exam table with your feet in footrests (stirrups).  The transducer will be covered with a protective cover and lubricated.  The transducer will be inserted into your vagina.  You may hear sounds of blood flow  and your baby's heartbeat. You may be able to see images of your baby on the computer screen.  After the exam, the transducer will be removed, and you can put your clothes back on.   What can I expect after the test?  You can drive yourself home and return to all your normal activities.  Your ultrasound images will be reviewed and a report will be sent to your doctor or midwife.  It is up to you to get your test results. Ask your health care provider, or the department that is doing the test, when your results will be ready. Questions to ask your health care provider  Why am I having this prenatal ultrasound?  What information will this exam provide?  How much does this exam cost? What costs will my insurance cover?  Can my partner or support person be with me during the exam?  When can I expect to get the results? Summary  A prenatal ultrasound is a safe and painless imaging exam that gives information about your pregnancy and your developing baby.  Routine ultrasounds are usually done between 18 and 22 weeks of pregnancy. Transvaginal ultrasound exams are often done in early pregnancy. You may have other prenatal ultrasounds as needed.  This exam has no known risks for you or your baby. After the exam, you can go home and return to all your normal activities.  Your health care provider will review the results of your ultrasound exam with you. Talk to your health care provider if you have any concerns before your exam or about your exam results. This information is not intended to replace advice given to you by your health care provider. Make sure you discuss any questions you have with your health care provider. Document Revised: 01/23/2020 Document Reviewed: 01/23/2020 Elsevier Patient Education  2021 [employer]  

## 2020-06-12 NOTE — Progress Notes (Signed)
Needs refill on Phenergan

## 2020-06-12 NOTE — Progress Notes (Signed)
LOW-RISK PREGNANCY OFFICE VISIT Patient name: Kimberly Houston MRN [id number]  Date of birth: [birth year] Chief Complaint:   Routine Prenatal Visit  History of Present Illness:   Kimberly Houston is a 25 y.o. G41P1001 female at [redacted]w[redacted]d with an Estimated Date of Delivery: 11/15/20 being seen today for ongoing management of a low-risk pregnancy.  Today she reports "I haven't gained any weight and the doctor I saw told me I needed to start drinking Pediasure. I need a Rx faxed or printed so I can take it to the [facility] office, so they will pay for the Pediasure. Contractions: Not present. Vag. Bleeding: None.  Movement: Present. denies leaking of fluid. Review of Systems:   Pertinent items are noted in HPI Denies abnormal vaginal discharge w/ itching/odor/irritation, headaches, visual changes, shortness of breath, chest pain, abdominal pain, severe nausea/vomiting, or problems with urination or bowel movements unless otherwise stated above. Pertinent History Reviewed:  Reviewed past medical,surgical, social, obstetrical and family history.  Reviewed problem list, medications and allergies. Physical Assessment:   Vitals:   06/12/20 1450  BP: 109/69  Pulse: 88  Weight: 255 lb (115.7 kg)  Body mass index is 46.64 kg/m.        Physical Examination:   General appearance: Well appearing, and in no distress  Mental status: Alert, oriented to person, place, and time  Skin: Warm & dry  Cardiovascular: Normal heart rate noted  Respiratory: Normal respiratory effort, no distress  Abdomen: Soft, gravid, nontender  Pelvic: Cervical exam deferred         Extremities: Edema: None  Fetal Status: Fetal Heart Rate (bpm): 135   Movement: Present    No results found for this or any previous visit (from the past 24 hour(s)).  Assessment & Plan:  1) Low-risk pregnancy G2P1001 at [redacted]w[redacted]d with an Estimated Date of Delivery: 11/15/20   2) Encounter for supervision of other normal pregnancy in second trimester   - INTEGRATED 2 - To schedule anatomy U/S  3) [redacted] weeks gestation of pregnancy   4) Low weight gain during pregnancy in second trimester  - Rx for Nutritional Supplements (PEDIASURE GROW & GAIN) LIQD BID (AM & hs)  5) Nausea and vomiting during pregnancy prior to [redacted] weeks gestation  - Refill Rx for promethazine (PHENERGAN) 25 MG tablet    Meds:  Meds ordered this encounter  Medications  . promethazine (PHENERGAN) 25 MG tablet    Sig: Take 1 tablet (25 mg total) by mouth every 6 (six) hours as needed for nausea or vomiting.    Dispense:  30 tablet    Refill:  1    Order Specific Question:   Supervising Provider    Answer:   Reva Bores [[id number]]  . Nutritional Supplements (PEDIASURE GROW & GAIN) LIQD    Sig: Take 1 Bottle by mouth in the morning and at bedtime.    Dispense:  237 mL    Refill:  2    Order Specific Question:   Supervising Provider    Answer:   Reva Bores [[id number]]   Labs/procedures today: CCUA & UDS  Plan:  Continue routine obstetrical care   Reviewed: Preterm labor symptoms and general obstetric precautions including but not limited to vaginal bleeding, contractions, leaking of fluid and fetal movement were reviewed in detail with the patient.  All questions were answered. Has home bp cuff. Check bp weekly, let us know if >140/90.   Follow-up: Return in about 3 weeks (around 07/03/2020) for Return OB  visit.  Orders Placed This Encounter  Procedures  . INTEGRATED 2   Raelyn Mora MSN, CNM 06/12/2020 3:15 PM

## 2020-06-13 ENCOUNTER — Telehealth: Payer: Self-pay

## 2020-06-13 NOTE — Telephone Encounter (Signed)
Kimberly Houston with the [facility] office called stating they have not received a prescription for Ensure for this patient. The fax # is [phone]

## 2020-06-14 LAB — INTEGRATED 2
AFP MoM: 1.01
Alpha-Fetoprotein: 24.6 ng/mL
Crown Rump Length: 56 mm
DIA MoM: 0.84
DIA Value: 96 pg/mL
Estriol, Unconjugated: 1.7 ng/mL
Gest. Age on Collection Date: 12 weeks
Gestational Age: 17.7 weeks
Maternal Age at EDD: 22.6 yr
Nuchal Translucency (NT): 1.2 mm
Nuchal Translucency MoM: 0.96
Number of Fetuses: 1
PAPP-A MoM: 0.45
PAPP-A Value: 183.7 ng/mL
Test Results:: NEGATIVE
Weight: 264 [lb_av]
Weight: 264 [lb_av]
hCG MoM: 0.6
hCG Value: 10.4 IU/mL
uE3 MoM: 1.51

## 2020-06-14 LAB — PMP SCREEN PROFILE (10S), URINE
Amphetamine Scrn, Ur: NEGATIVE ng/mL
BARBITURATE SCREEN URINE: NEGATIVE ng/mL
BENZODIAZEPINE SCREEN, URINE: NEGATIVE ng/mL
CANNABINOIDS UR QL SCN: NEGATIVE ng/mL
Cocaine (Metab) Scrn, Ur: NEGATIVE ng/mL
Creatinine(Crt), U: 253 mg/dL (ref 20.0–300.0)
Methadone Screen, Urine: NEGATIVE ng/mL
OXYCODONE+OXYMORPHONE UR QL SCN: NEGATIVE ng/mL
Opiate Scrn, Ur: NEGATIVE ng/mL
Ph of Urine: 5.9 (ref 4.5–8.9)
Phencyclidine Qn, Ur: NEGATIVE ng/mL
Propoxyphene Scrn, Ur: NEGATIVE ng/mL

## 2020-06-15 LAB — URINE CULTURE

## 2020-06-17 NOTE — Telephone Encounter (Signed)
Per Joellyn Haff pt does not need Ensure for pregnancy. Her recommended weight gain based on her BMI is 11 lbs. Will let patient know Ensure not recommended.

## 2020-07-02 ENCOUNTER — Other Ambulatory Visit: Payer: Self-pay | Admitting: Obstetrics and Gynecology

## 2020-07-02 DIAGNOSIS — Z363 Encounter for antenatal screening for malformations: Secondary | ICD-10-CM

## 2020-07-03 ENCOUNTER — Ambulatory Visit (INDEPENDENT_AMBULATORY_CARE_PROVIDER_SITE_OTHER): Payer: Medicaid Other

## 2020-07-03 ENCOUNTER — Other Ambulatory Visit: Payer: Self-pay

## 2020-07-03 ENCOUNTER — Ambulatory Visit (INDEPENDENT_AMBULATORY_CARE_PROVIDER_SITE_OTHER): Payer: Medicaid Other | Admitting: Advanced Practice Midwife

## 2020-07-03 VITALS — BP 112/59 | HR 69 | Wt 256.8 lb

## 2020-07-03 DIAGNOSIS — Z363 Encounter for antenatal screening for malformations: Secondary | ICD-10-CM

## 2020-07-03 DIAGNOSIS — Z98891 History of uterine scar from previous surgery: Secondary | ICD-10-CM

## 2020-07-03 DIAGNOSIS — Z3480 Encounter for supervision of other normal pregnancy, unspecified trimester: Secondary | ICD-10-CM

## 2020-07-03 DIAGNOSIS — F17200 Nicotine dependence, unspecified, uncomplicated: Secondary | ICD-10-CM

## 2020-07-03 DIAGNOSIS — Z1389 Encounter for screening for other disorder: Secondary | ICD-10-CM

## 2020-07-03 DIAGNOSIS — Z3A20 20 weeks gestation of pregnancy: Secondary | ICD-10-CM

## 2020-07-03 NOTE — Progress Notes (Signed)
[place] 20+5 wks,cephalic,cx 3.1 cm,posterior placenta gr 0,normal ovaries,svp of fluid 5.4 cm,mild right renal pelvic dilatation 4 mm,fhr 152 bpm,EFW 393 g 60%,anatomy complete

## 2020-07-03 NOTE — Patient Instructions (Signed)
Kimberly Houston, I greatly value your feedback.  If you receive a survey following your visit with [place] today, we appreciate you taking the time to fill it out.  Thanks, Kimberly Houston CNM  [facility] ([address] [place], [place] [address]) Entrance C, located off of [address] valet parking  Go to [email or web address] to register for FREE online childbirth classes  [place] Pediatricians/Family Doctors:  [facility] [phone]            [facility] [phone]                 [facility] [phone] (usually not accepting new patients unless you have family there already, you are always welcome to call and ask)       [facility] Department [phone]       [facility] Pediatricians/Family Doctors:   [facility]: [phone]  [facility]: [phone]  [facility]: [phone]  [facility] Doctors:   [facility]: [phone]   [facility]: [phone]  [facility] Doctors:  [facility]: [phone]    Home Blood Pressure Monitoring for Patients   Your provider has recommended that you check your blood pressure (BP) at least once a week at home. If you do not have a blood pressure cuff at home, one will be provided for you. Contact your provider if you have not received your monitor within 1 week.   Helpful Tips for Accurate Home Blood Pressure Checks  . Don't smoke, exercise, or drink caffeine 30 minutes before checking your BP . Use the restroom before checking your BP (a full bladder can raise your pressure) . Relax in a comfortable upright chair . Feet on the ground . Left arm resting comfortably on a flat surface at the level of your heart . Legs uncrossed . Back supported . Sit quietly and don't talk . Place the cuff on your bare arm . Adjust snuggly, so that only  two fingertips can fit between your skin and the top of the cuff . Check 2 readings separated by at least one minute . Keep a log of your BP readings . For a visual, please reference this diagram: http://ccnc.care/bpdiagram  Provider Name: [facility]     Phone: [phone]  Zone 1: ALL CLEAR  Continue to monitor your symptoms:  . BP reading is less than 140 (top number) or less than 90 (bottom number)  . No right upper stomach pain . No headaches or seeing spots . No feeling nauseated or throwing up . No swelling in face and hands  Zone 2: CAUTION Call your doctor's office for any of the following:  . BP reading is greater than 140 (top number) or greater than 90 (bottom number)  . Stomach pain under your ribs in the middle or right side . Headaches or seeing spots . Feeling nauseated or throwing up . Swelling in face and hands  Zone 3: EMERGENCY  Seek immediate medical care if you have any of the following:  . BP reading is greater than160 (top number) or greater than 110 (bottom number) . Severe headaches not improving with Tylenol . Serious difficulty catching your breath . Any worsening symptoms from Zone 2     Second Trimester of Pregnancy The second trimester is from week 14 through week 27 (months 4 through 6). The second trimester is often a time when you feel your best. Your  body has adjusted to being pregnant, and you begin to feel better physically. Usually, morning sickness has lessened or quit completely, you may have more energy, and you may have an increase in appetite. The second trimester is also a time when the fetus is growing rapidly. At the end of the sixth month, the fetus is about 9 inches long and weighs about 1 pounds. You will likely begin to feel the baby move (quickening) between 16 and 20 weeks of pregnancy. Body changes during your second trimester Your body continues to go through many changes during your second trimester. The changes vary  from woman to woman.  Your weight will continue to increase. You will notice your lower abdomen bulging out.  You may begin to get stretch marks on your hips, abdomen, and breasts.  You may develop headaches that can be relieved by medicines. The medicines should be approved by your health care provider.  You may urinate more often because the fetus is pressing on your bladder.  You may develop or continue to have heartburn as a result of your pregnancy.  You may develop constipation because certain hormones are causing the muscles that push waste through your intestines to slow down.  You may develop hemorrhoids or swollen, bulging veins (varicose veins).  You may have back pain. This is caused by: ? Weight gain. ? Pregnancy hormones that are relaxing the joints in your pelvis. ? A shift in weight and the muscles that support your balance.  Your breasts will continue to grow and they will continue to become tender.  Your gums may bleed and may be sensitive to brushing and flossing.  Dark spots or blotches (chloasma, mask of pregnancy) may develop on your face. This will likely fade after the baby is born.  A dark line from your belly button to the pubic area (linea nigra) may appear. This will likely fade after the baby is born.  You may have changes in your hair. These can include thickening of your hair, rapid growth, and changes in texture. Some women also have hair loss during or after pregnancy, or hair that feels dry or thin. Your hair will most likely return to normal after your baby is born.  What to expect at prenatal visits During a routine prenatal visit:  You will be weighed to make sure you and the fetus are growing normally.  Your blood pressure will be taken.  Your abdomen will be measured to track your baby's growth.  The fetal heartbeat will be listened to.  Any test results from the previous visit will be discussed.  Your health care provider may ask  you:  How you are feeling.  If you are feeling the baby move.  If you have had any abnormal symptoms, such as leaking fluid, bleeding, severe headaches, or abdominal cramping.  If you are using any tobacco products, including cigarettes, chewing tobacco, and electronic cigarettes.  If you have any questions.  Other tests that may be performed during your second trimester include:  Blood tests that check for: ? Low iron levels (anemia). ? High blood sugar that affects pregnant women (gestational diabetes) between 78 and 28 weeks. ? Rh antibodies. This is to check for a protein on red blood cells (Rh factor).  Urine tests to check for infections, diabetes, or protein in the urine.  An ultrasound to confirm the proper growth and development of the baby.  An amniocentesis to check for possible genetic problems.  Fetal screens for  spina bifida and Down syndrome.  HIV (human immunodeficiency virus) testing. Routine prenatal testing includes screening for HIV, unless you choose not to have this test.  Follow these instructions at home: Medicines  Follow your health care provider's instructions regarding medicine use. Specific medicines may be either safe or unsafe to take during pregnancy.  Take a prenatal vitamin that contains at least 600 micrograms (mcg) of folic acid.  If you develop constipation, try taking a stool softener if your health care provider approves. Eating and drinking  Eat a balanced diet that includes fresh fruits and vegetables, whole grains, good sources of protein such as meat, eggs, or tofu, and low-fat dairy. Your health care provider will help you determine the amount of weight gain that is right for you.  Avoid raw meat and uncooked cheese. These carry germs that can cause birth defects in the baby.  If you have low calcium intake from food, talk to your health care provider about whether you should take a daily calcium supplement.  Limit foods that  are high in fat and processed sugars, such as fried and sweet foods.  To prevent constipation: ? Drink enough fluid to keep your urine clear or pale yellow. ? Eat foods that are high in fiber, such as fresh fruits and vegetables, whole grains, and beans. Activity  Exercise only as directed by your health care provider. Most women can continue their usual exercise routine during pregnancy. Try to exercise for 30 minutes at least 5 days a week. Stop exercising if you experience uterine contractions.  Avoid heavy lifting, wear low heel shoes, and practice good posture.  A sexual relationship may be continued unless your health care provider directs you otherwise. Relieving pain and discomfort  Wear a good support bra to prevent discomfort from breast tenderness.  Take warm sitz baths to soothe any pain or discomfort caused by hemorrhoids. Use hemorrhoid cream if your health care provider approves.  Rest with your legs elevated if you have leg cramps or low back pain.  If you develop varicose veins, wear support hose. Elevate your feet for 15 minutes, 3-4 times a day. Limit salt in your diet. Prenatal Care  Write down your questions. Take them to your prenatal visits.  Keep all your prenatal visits as told by your health care provider. This is important. Safety  Wear your seat belt at all times when driving.  Make a list of emergency phone numbers, including numbers for family, friends, the hospital, and police and fire departments. General instructions  Ask your health care provider for a referral to a local prenatal education class. Begin classes no later than the beginning of month 6 of your pregnancy.  Ask for help if you have counseling or nutritional needs during pregnancy. Your health care provider can offer advice or refer you to specialists for help with various needs.  Do not use hot tubs, steam rooms, or saunas.  Do not douche or use tampons or scented sanitary  pads.  Do not cross your legs for long periods of time.  Avoid cat litter boxes and soil used by cats. These carry germs that can cause birth defects in the baby and possibly loss of the fetus by miscarriage or stillbirth.  Avoid all smoking, herbs, alcohol, and unprescribed drugs. Chemicals in these products can affect the formation and growth of the baby.  Do not use any products that contain nicotine or tobacco, such as cigarettes and e-cigarettes. If you need help quitting, ask  your health care provider.  Visit your dentist if you have not gone yet during your pregnancy. Use a soft toothbrush to brush your teeth and be gentle when you floss. Contact a health care provider if:  You have dizziness.  You have mild pelvic cramps, pelvic pressure, or nagging pain in the abdominal area.  You have persistent nausea, vomiting, or diarrhea.  You have a bad smelling vaginal discharge.  You have pain when you urinate. Get help right away if:  You have a fever.  You are leaking fluid from your vagina.  You have spotting or bleeding from your vagina.  You have severe abdominal cramping or pain.  You have rapid weight gain or weight loss.  You have shortness of breath with chest pain.  You notice sudden or extreme swelling of your face, hands, ankles, feet, or legs.  You have not felt your baby move in over an hour.  You have severe headaches that do not go away when you take medicine.  You have vision changes. Summary  The second trimester is from week 14 through week 27 (months 4 through 6). It is also a time when the fetus is growing rapidly.  Your body goes through many changes during pregnancy. The changes vary from woman to woman.  Avoid all smoking, herbs, alcohol, and unprescribed drugs. These chemicals affect the formation and growth your baby.  Do not use any tobacco products, such as cigarettes, chewing tobacco, and e-cigarettes. If you need help quitting, ask your  health care provider.  Contact your health care provider if you have any questions. Keep all prenatal visits as told by your health care provider. This is important. This information is not intended to replace advice given to you by your health care provider. Make sure you discuss any questions you have with your health care provider. Document Released: 04/28/2001 Document Revised: 10/10/2015 Document Reviewed: 07/05/2012 Elsevier Interactive Patient Education  2017 [employer].

## 2020-07-03 NOTE — Progress Notes (Signed)
   LOW-RISK PREGNANCY VISIT Patient name: Kimberly Houston MRN [id number]  Date of birth: 12-04-1995 Chief Complaint:   Routine Prenatal Visit  History of Present Illness:   Kimberly Houston is a 25 y.o. G10P1001 female at [redacted]w[redacted]d with an Estimated Date of Delivery: 11/15/20 being seen today for ongoing management of a low-risk pregnancy.  Today she reports feeling well; wants to quit smoking . Contractions: Not present. Vag. Bleeding: Scant.  Movement: Present. denies leaking of fluid. Review of Systems:   Pertinent items are noted in HPI Denies abnormal vaginal discharge w/ itching/odor/irritation, headaches, visual changes, shortness of breath, chest pain, abdominal pain, severe nausea/vomiting, or problems with urination or bowel movements unless otherwise stated above. Pertinent History Reviewed:  Reviewed past medical,surgical, social, obstetrical and family history.  Reviewed problem list, medications and allergies. Physical Assessment:   Vitals:   07/03/20 1145  BP: (!) 112/59  Pulse: 69  Weight: 256 lb 12.8 oz (116.5 kg)  Body mass index is 46.97 kg/m.        Physical Examination:   General appearance: Well appearing, and in no distress  Mental status: Alert, oriented to person, place, and time  Skin: Warm & dry  Cardiovascular: Normal heart rate noted  Respiratory: Normal respiratory effort, no distress  Abdomen: Soft, gravid, nontender  Pelvic: Cervical exam deferred         Extremities: Edema: None  Fetal Status: Fetal Heart Rate (bpm): 152 u/s   Movement: Present    Anatomy u/s: [place] 20+5 wks,cephalic,cx 3.1 cm,posterior placenta gr 0,normal ovaries,svp of fluid 5.4 cm,mild right renal pelvic dilatation 4 mm,fhr 152 bpm,EFW 393 g 60%,anatomy complete  No results found for this or any previous visit (from the past 24 hour(s)).  Assessment & Plan:  1) Low-risk pregnancy G2P1001 at [redacted]w[redacted]d with an Estimated Date of Delivery: 11/15/20   2) Fetal mild R renal pelvic dilation,  59mm, neg screening  3) Previous C/S for [facility], wants to TOLAC, will see [facility] around 36wks for consent  4) Prev GDMA2, nl early HgbA1c  5) Prev gHTN, taking bASA   6) Rh neg, Rhogam ~ 28wks   7) Smoker, wants a referral to [facility]> faxed today   Meds: No orders of the defined types were placed in this encounter.  Labs/procedures today: anatomy u/s  Plan:  Continue routine obstetrical care   Reviewed: Preterm labor symptoms and general obstetric precautions including but not limited to vaginal bleeding, contractions, leaking of fluid and fetal movement were reviewed in detail with the patient.  All questions were answered. Didn't ask about home bp cuff. Check bp weekly, let us know if >140/90.   Follow-up: Return in about 4 weeks (around 07/31/2020) for LROB, in person.  Orders Placed This Encounter  Procedures  . Urine Culture   Arabella Merles CNM 07/03/2020 12:26 PM

## 2020-07-04 ENCOUNTER — Encounter: Payer: Medicaid Other | Admitting: Women's Health

## 2020-07-05 LAB — URINE CULTURE

## 2020-07-31 ENCOUNTER — Ambulatory Visit (INDEPENDENT_AMBULATORY_CARE_PROVIDER_SITE_OTHER): Payer: Medicaid Other | Admitting: Women's Health

## 2020-07-31 ENCOUNTER — Other Ambulatory Visit: Payer: Self-pay

## 2020-07-31 ENCOUNTER — Encounter: Payer: Self-pay | Admitting: Women's Health

## 2020-07-31 VITALS — BP 99/65 | HR 83 | Wt 253.2 lb

## 2020-07-31 DIAGNOSIS — Z3482 Encounter for supervision of other normal pregnancy, second trimester: Secondary | ICD-10-CM

## 2020-07-31 DIAGNOSIS — Z3480 Encounter for supervision of other normal pregnancy, unspecified trimester: Secondary | ICD-10-CM

## 2020-07-31 NOTE — Patient Instructions (Signed)
Kimberly Houston, I greatly value your feedback.  If you receive a survey following your visit with [place] today, we appreciate you taking the time to fill it out.  Thanks, Joellyn Haff, CNM, WHNP-BC   You will have your sugar test next visit.  Please do not eat or drink anything after midnight the night before you come, not even water.  You will be here for at least two hours.  Please make an appointment online for the bloodwork at [email or web address] for 8:30am (or as close to this as possible). Make sure you select the [facility] service center. The day of the appointment, check in with our office first, then you will go to [facility] to start the sugar test.    [facility] [place], [place] [address]) Entrance C, located off of [address] valet parking  Go to [email or web address] to register for FREE online childbirth classes   Call the office [phone]) or go to [facility] if:  You begin to have strong, frequent contractions  Your water breaks.  Sometimes it is a big gush of fluid, sometimes it is just a trickle that keeps getting your panties wet or running down your legs  You have vaginal bleeding.  It is normal to have a small amount of spotting if your cervix was checked.   You don't feel your baby moving like normal.  If you don't, get you something to eat and drink and lay down and focus on feeling your baby move.   If your baby is still not moving like normal, you should call the office or go to [facility].  [place] Pediatricians/Family Doctors:  [facility] [phone]            [facility] [phone]                 [facility] [phone] (usually not accepting new patients unless you have family there already, you are always welcome to call and ask)       [facility] Department [phone]       [facility] Pediatricians/Family Doctors:   [facility]:  [phone]  [facility]: [phone]  [facility]: [phone]  [facility] Doctors:   [facility]: [phone]   [facility]: [phone]  [facility] Doctors:  [facility]: [phone]   Home Blood Pressure Monitoring for Patients   Your provider has recommended that you check your blood pressure (BP) at least once a week at home. If you do not have a blood pressure cuff at home, one will be provided for you. Contact your provider if you have not received your monitor within 1 week.   Helpful Tips for Accurate Home Blood Pressure Checks  . Don't smoke, exercise, or drink caffeine 30 minutes before checking your BP . Use the restroom before checking your BP (a full bladder can raise your pressure) . Relax in a comfortable upright chair . Feet on the ground . Left arm resting comfortably on a flat surface at the level of your heart . Legs uncrossed . Back supported . Sit quietly and don't talk . Place the cuff on your bare arm . Adjust snuggly, so that only two fingertips can fit between your skin and the top of the cuff . Check 2 readings separated by at least one minute . Keep a log of your BP readings . For a visual, please reference  this diagram: http://ccnc.care/bpdiagram  Provider Name: [facility]     Phone: [phone]  Zone 1: ALL CLEAR  Continue to monitor your symptoms:  . BP reading is less than 140 (top number) or less than 90 (bottom number)  . No right upper stomach pain . No headaches or seeing spots . No feeling nauseated or throwing up . No swelling in face and hands  Zone 2: CAUTION Call your doctor's office for any of the following:  . BP reading is greater than 140 (top number) or greater than 90 (bottom number)  . Stomach pain under your ribs in the middle or right side . Headaches or seeing spots . Feeling nauseated or throwing up . Swelling in  face and hands  Zone 3: EMERGENCY  Seek immediate medical care if you have any of the following:  . BP reading is greater than160 (top number) or greater than 110 (bottom number) . Severe headaches not improving with Tylenol . Serious difficulty catching your breath . Any worsening symptoms from Zone 2   Second Trimester of Pregnancy The second trimester is from week 13 through week 28, months 4 through 6. The second trimester is often a time when you feel your best. Your body has also adjusted to being pregnant, and you begin to feel better physically. Usually, morning sickness has lessened or quit completely, you may have more energy, and you may have an increase in appetite. The second trimester is also a time when the fetus is growing rapidly. At the end of the sixth month, the fetus is about 9 inches long and weighs about 1 pounds. You will likely begin to feel the baby move (quickening) between 18 and 20 weeks of the pregnancy. BODY CHANGES Your body goes through many changes during pregnancy. The changes vary from woman to woman.   Your weight will continue to increase. You will notice your lower abdomen bulging out.  You may begin to get stretch marks on your hips, abdomen, and breasts.  You may develop headaches that can be relieved by medicines approved by your health care provider.  You may urinate more often because the fetus is pressing on your bladder.  You may develop or continue to have heartburn as a result of your pregnancy.  You may develop constipation because certain hormones are causing the muscles that push waste through your intestines to slow down.  You may develop hemorrhoids or swollen, bulging veins (varicose veins).  You may have back pain because of the weight gain and pregnancy hormones relaxing your joints between the bones in your pelvis and as a result of a shift in weight and the muscles that support your balance.  Your breasts will continue to grow  and be tender.  Your gums may bleed and may be sensitive to brushing and flossing.  Dark spots or blotches (chloasma, mask of pregnancy) may develop on your face. This will likely fade after the baby is born.  A dark line from your belly button to the pubic area (linea nigra) may appear. This will likely fade after the baby is born.  You may have changes in your hair. These can include thickening of your hair, rapid growth, and changes in texture. Some women also have hair loss during or after pregnancy, or hair that feels dry or thin. Your hair will most likely return to normal after your baby is born. WHAT TO EXPECT AT YOUR PRENATAL VISITS During a routine prenatal visit:  You will  be weighed to make sure you and the fetus are growing normally.  Your blood pressure will be taken.  Your abdomen will be measured to track your baby's growth.  The fetal heartbeat will be listened to.  Any test results from the previous visit will be discussed. Your health care provider may ask you:  How you are feeling.  If you are feeling the baby move.  If you have had any abnormal symptoms, such as leaking fluid, bleeding, severe headaches, or abdominal cramping.  If you have any questions. Other tests that may be performed during your second trimester include:  Blood tests that check for:  Low iron levels (anemia).  Gestational diabetes (between 24 and 28 weeks).  Rh antibodies.  Urine tests to check for infections, diabetes, or protein in the urine.  An ultrasound to confirm the proper growth and development of the baby.  An amniocentesis to check for possible genetic problems.  Fetal screens for spina bifida and Down syndrome. HOME CARE INSTRUCTIONS   Avoid all smoking, herbs, alcohol, and unprescribed drugs. These chemicals affect the formation and growth of the baby.  Follow your health care provider's instructions regarding medicine use. There are medicines that are either  safe or unsafe to take during pregnancy.  Exercise only as directed by your health care provider. Experiencing uterine cramps is a good sign to stop exercising.  Continue to eat regular, healthy meals.  Wear a good support bra for breast tenderness.  Do not use hot tubs, steam rooms, or saunas.  Wear your seat belt at all times when driving.  Avoid raw meat, uncooked cheese, cat litter boxes, and soil used by cats. These carry germs that can cause birth defects in the baby.  Take your prenatal vitamins.  Try taking a stool softener (if your health care provider approves) if you develop constipation. Eat more high-fiber foods, such as fresh vegetables or fruit and whole grains. Drink plenty of fluids to keep your urine clear or pale yellow.  Take warm sitz baths to soothe any pain or discomfort caused by hemorrhoids. Use hemorrhoid cream if your health care provider approves.  If you develop varicose veins, wear support hose. Elevate your feet for 15 minutes, 3-4 times a day. Limit salt in your diet.  Avoid heavy lifting, wear low heel shoes, and practice good posture.  Rest with your legs elevated if you have leg cramps or low back pain.  Visit your dentist if you have not gone yet during your pregnancy. Use a soft toothbrush to brush your teeth and be gentle when you floss.  A sexual relationship may be continued unless your health care provider directs you otherwise.  Continue to go to all your prenatal visits as directed by your health care provider. SEEK MEDICAL CARE IF:   You have dizziness.  You have mild pelvic cramps, pelvic pressure, or nagging pain in the abdominal area.  You have persistent nausea, vomiting, or diarrhea.  You have a bad smelling vaginal discharge.  You have pain with urination. SEEK IMMEDIATE MEDICAL CARE IF:   You have a fever.  You are leaking fluid from your vagina.  You have spotting or bleeding from your vagina.  You have severe  abdominal cramping or pain.  You have rapid weight gain or loss.  You have shortness of breath with chest pain.  You notice sudden or extreme swelling of your face, hands, ankles, feet, or legs.  You have not felt your baby move in  over an hour.  You have severe headaches that do not go away with medicine.  You have vision changes. Document Released: 04/28/2001 Document Revised: 05/09/2013 Document Reviewed: 07/05/2012 [facility] Patient Information 2015 [place], [place]. This information is not intended to replace advice given to you by your health care provider. Make sure you discuss any questions you have with your health care provider.

## 2020-07-31 NOTE — Progress Notes (Signed)
   LOW-RISK PREGNANCY VISIT Patient name: Kimberly Houston MRN [id number]  Date of birth: 12/06/1995 Chief Complaint:   Routine Prenatal Visit  History of Present Illness:   Kimberly Houston is a 25 y.o. G56P1001 female at [redacted]w[redacted]d with an Estimated Date of Delivery: 11/15/20 being seen today for ongoing management of a low-risk pregnancy.  Depression screen [facility] 2/9 05/06/2020 04/17/2020  Decreased Interest 0 0  Down, Depressed, Hopeless 0 0  PHQ - 2 Score 0 0  Altered sleeping 3 2  Tired, decreased energy 1 2  Change in appetite 1 0  Feeling bad or failure about yourself  0 0  Trouble concentrating 3 0  Moving slowly or fidgety/restless 0 0  Suicidal thoughts 0 0  PHQ-9 Score 8 4    Today she reports no complaints. Contractions: Irregular.  .  Movement: Present. denies leaking of fluid. Review of Systems:   Pertinent items are noted in HPI Denies abnormal vaginal discharge w/ itching/odor/irritation, headaches, visual changes, shortness of breath, chest pain, abdominal pain, severe nausea/vomiting, or problems with urination or bowel movements unless otherwise stated above. Pertinent History Reviewed:  Reviewed past medical,surgical, social, obstetrical and family history.  Reviewed problem list, medications and allergies. Physical Assessment:   Vitals:   07/31/20 1136  BP: 99/65  Pulse: 83  Weight: 253 lb 3.2 oz (114.9 kg)  Body mass index is 46.31 kg/m.        Physical Examination:   General appearance: Well appearing, and in no distress  Mental status: Alert, oriented to person, place, and time  Skin: Warm & dry  Cardiovascular: Normal heart rate noted  Respiratory: Normal respiratory effort, no distress  Abdomen: Soft, gravid, nontender  Pelvic: Cervical exam deferred         Extremities: Edema: Trace  Fetal Status: Fetal Heart Rate (bpm): 147 Fundal Height: 24 cm Movement: Present    Chaperone: N/A   No results found for this or any previous visit (from the past 24  hour(s)).  Assessment & Plan:  1) Low-risk pregnancy G2P1001 at [redacted]w[redacted]d with an Estimated Date of Delivery: 11/15/20   2) Prev c/s, wants TOLAC- next visit w/ MD to sign  3) H/O GHTN> on ASA  4) H/O GDM> early A1C normal, GTT next visit  5) Fetal mild Rt RPD> repeat u/s @ 32wks   Meds: No orders of the defined types were placed in this encounter.  Labs/procedures today: none  Plan:  Continue routine obstetrical care  Next visit: prefers will be in person for pn2    Reviewed: Preterm labor symptoms and general obstetric precautions including but not limited to vaginal bleeding, contractions, leaking of fluid and fetal movement were reviewed in detail with the patient.  All questions were answered. Has home bp cuff.  Check bp weekly, let us know if >140/90.   Follow-up: Return in about 4 weeks (around 08/28/2020) for LROB, PN2, MD only (to sign TOLAC consent), in person.  Future Appointments  Date Time Provider Department Center  09/03/2020  8:30 AM [facility]  09/03/2020  8:50 AM Eure, Amaryllis Dyke, MD [facility]    No orders of the defined types were placed in this encounter.  Cheral Marker CNM, [facility] 07/31/2020 12:40 PM

## 2020-08-01 ENCOUNTER — Inpatient Hospital Stay (HOSPITAL_COMMUNITY)
Admission: AD | Admit: 2020-08-01 | Discharge: 2020-08-01 | Disposition: A | Discharge status: Home / Self Care | Payer: Medicaid Other | Attending: Obstetrics and Gynecology | Admitting: Obstetrics and Gynecology

## 2020-08-01 ENCOUNTER — Encounter (HOSPITAL_COMMUNITY): Payer: Self-pay | Admitting: Obstetrics and Gynecology

## 2020-08-01 ENCOUNTER — Other Ambulatory Visit: Payer: Self-pay

## 2020-08-01 DIAGNOSIS — O4692 Antepartum hemorrhage, unspecified, second trimester: Secondary | ICD-10-CM | POA: Diagnosis not present

## 2020-08-01 DIAGNOSIS — Z880 Allergy status to penicillin: Secondary | ICD-10-CM | POA: Insufficient documentation

## 2020-08-01 DIAGNOSIS — R102 Pelvic and perineal pain: Secondary | ICD-10-CM | POA: Insufficient documentation

## 2020-08-01 DIAGNOSIS — M549 Dorsalgia, unspecified: Secondary | ICD-10-CM | POA: Diagnosis not present

## 2020-08-01 DIAGNOSIS — O09292 Supervision of pregnancy with other poor reproductive or obstetric history, second trimester: Secondary | ICD-10-CM | POA: Diagnosis not present

## 2020-08-01 DIAGNOSIS — O99332 Smoking (tobacco) complicating pregnancy, second trimester: Secondary | ICD-10-CM | POA: Insufficient documentation

## 2020-08-01 DIAGNOSIS — Z79899 Other long term (current) drug therapy: Secondary | ICD-10-CM | POA: Diagnosis not present

## 2020-08-01 DIAGNOSIS — Z7982 Long term (current) use of aspirin: Secondary | ICD-10-CM | POA: Diagnosis not present

## 2020-08-01 DIAGNOSIS — O4690 Antepartum hemorrhage, unspecified, unspecified trimester: Secondary | ICD-10-CM

## 2020-08-01 DIAGNOSIS — O26892 Other specified pregnancy related conditions, second trimester: Secondary | ICD-10-CM | POA: Insufficient documentation

## 2020-08-01 DIAGNOSIS — Z881 Allergy status to other antibiotic agents status: Secondary | ICD-10-CM | POA: Diagnosis not present

## 2020-08-01 DIAGNOSIS — Z560 Unemployment, unspecified: Secondary | ICD-10-CM | POA: Insufficient documentation

## 2020-08-01 DIAGNOSIS — Z91040 Latex allergy status: Secondary | ICD-10-CM | POA: Insufficient documentation

## 2020-08-01 DIAGNOSIS — Z3480 Encounter for supervision of other normal pregnancy, unspecified trimester: Secondary | ICD-10-CM

## 2020-08-01 DIAGNOSIS — Z8759 Personal history of other complications of pregnancy, childbirth and the puerperium: Secondary | ICD-10-CM | POA: Insufficient documentation

## 2020-08-01 DIAGNOSIS — Z3A24 24 weeks gestation of pregnancy: Secondary | ICD-10-CM | POA: Insufficient documentation

## 2020-08-01 DIAGNOSIS — O360120 Maternal care for anti-D [Rh] antibodies, second trimester, not applicable or unspecified: Secondary | ICD-10-CM | POA: Diagnosis not present

## 2020-08-01 DIAGNOSIS — F17210 Nicotine dependence, cigarettes, uncomplicated: Secondary | ICD-10-CM | POA: Insufficient documentation

## 2020-08-01 LAB — URINALYSIS, ROUTINE W REFLEX MICROSCOPIC
Bilirubin Urine: NEGATIVE
Glucose, UA: NEGATIVE mg/dL
Hgb urine dipstick: NEGATIVE
Ketones, ur: NEGATIVE mg/dL
Nitrite: NEGATIVE
Protein, ur: NEGATIVE mg/dL
Specific Gravity, Urine: 1.024 (ref 1.005–1.030)
pH: 6 (ref 5.0–8.0)

## 2020-08-01 LAB — WET PREP, GENITAL
Clue Cells Wet Prep HPF POC: NONE SEEN
Sperm: NONE SEEN
Trich, Wet Prep: NONE SEEN
Yeast Wet Prep HPF POC: NONE SEEN

## 2020-08-01 MED ORDER — RHO D IMMUNE GLOBULIN 1500 UNIT/2ML IJ SOSY
300.0000 ug | PREFILLED_SYRINGE | Freq: Once | INTRAMUSCULAR | Status: AC
  Administered 2020-08-01: 300 ug via INTRAMUSCULAR
  Filled 2020-08-01: qty 2

## 2020-08-01 NOTE — MAU Note (Signed)
.  Kimberly Houston is a 25 y.o. at [redacted]w[redacted]d here in MAU reporting: she began experiencing lower back pain last night so she took a bath and "I shaved my legs and vagina so I would feel better and I saw a clot about the size of half of my pinky fingernail and since then I have been having spotting up until about and hour ago." She reports she did not cut herself. She reports she has scoliosis so the back pain is at times normal for her.   She reports after getting out of the bath tub she experienced a sharp pain on her right side that wraps around to her incision site from her previous C/S. She states that the pain has been constant and got increasingly worse when picking up her daughter earlier today.  Pain score: 9/10 FHT: initial 145  Lab orders placed from triage:  UA

## 2020-08-01 NOTE — MAU Provider Note (Signed)
History     [id number]  Arrival date and time: 08/01/20 1727    Chief Complaint  Patient presents with  . Vaginal Bleeding  . Pelvic Pain     HPI Kimberly Houston is a 25 y.o. at [redacted]w[redacted]d by 10wk [place] with PMHx notable for Rh negative, prior cesarean, hx of GDM and gHTN, who presents for vaginal bleeding.   Reports that last night around midnight had very mild pink spotting when she used the bathroom. Then took a shower and shaved her genitals and also cut herself. Continued to have some mild pink spotting (no bright red or brown blood) which subsequently stopped earlier today. Also endorsing some mild back pain worse when picking up her daughter, as well as intermittent pain in her RLQ. Denies burning or pain with urination, vaginal discharge, nausea, vomiting, fevers. No loss of fluid per vagina, no contractions. Baby has been moving normally. Has tried tylenol but only helps a little.   A/Negative/-- (12/17 1055)  OB History    Gravida  2   Para  1   Term  1   Preterm      AB      Living  1     SAB      IAB      Ectopic      Multiple      Live Births  1           Past Medical History:  Diagnosis Date  . Anxiety   . Asthma   . Depression     Past Surgical History:  Procedure Laterality Date  . c section    . CESAREAN SECTION    . TYMPANOSTOMY TUBE PLACEMENT  2018    Family History  Problem Relation Age of Onset  . Heart attack Paternal Grandfather   . COPD Paternal Grandmother   . Asthma Paternal Grandmother   . Hypertension Maternal Grandfather   . Diabetes Maternal Grandfather   . Seizures Father   . Alcohol abuse Father   . Other Mother        deg. disc disease    Social History   Socioeconomic History  . Marital status: Single    Spouse name: Not on file  . Number of children: Not on file  . Years of education: Not on file  . Highest education level: Not on file  Occupational History  . Occupation: unemployed  Tobacco Use  .  Smoking status: Current Every Day Smoker    Packs/day: 0.50    Years: 6.00    Pack years: 3.00    Types: Cigarettes  . Smokeless tobacco: Never Used  . Tobacco comment: smokes 1 cig daily  Vaping Use  . Vaping Use: Some days  Substance and Sexual Activity  . Alcohol use: Yes    Comment: "occasionally every now and then I will drink a wine cooler" - as of 08/01/2020  . Drug use: Not Currently  . Sexual activity: Yes    Birth control/protection: None  Other Topics Concern  . Not on file  Social History Narrative  . Not on file   Social Determinants of Health   Financial Resource Strain: Low Risk   . Difficulty of Paying Living Expenses: Not hard at all  Food Insecurity: No Food Insecurity  . Worried About Programme researcher, broadcasting/film/video in the Last Year: Never true  . Ran Out of Food in the Last Year: Never true  Transportation Needs: No Transportation Needs  . Lack  of Transportation (Medical): No  . Lack of Transportation (Non-Medical): No  Physical Activity: Sufficiently Active  . Days of Exercise per Week: 6 days  . Minutes of Exercise per Session: 100 min  Stress: Stress Concern Present  . Feeling of Stress : Very much  Social Connections: Moderately Isolated  . Frequency of Communication with Friends and Family: Twice a week  . Frequency of Social Gatherings with Friends and Family: Once a week  . Attends Religious Services: Never  . Active Member of Clubs or Organizations: No  . Attends Banker Meetings: Never  . Marital Status: Living with partner  Intimate Partner Violence: Not At Risk  . Fear of Current or Ex-Partner: No  . Emotionally Abused: No  . Physically Abused: No  . Sexually Abused: No    Allergies  Allergen Reactions  . Amoxicillin Hives  . Drug Ingredient [Azithromycin] Hives  . Erythromycin Hives  . Gentamicin Hives  . Metronidazole Hives  . Penicillins Hives  . Latex Rash    No current facility-administered medications on file prior to  encounter.   Current Outpatient Medications on File Prior to Encounter  Medication Sig Dispense Refill  . acetaminophen (TYLENOL) 500 MG tablet Take 500 mg by mouth every 6 (six) hours as needed.    Marland Kitchen aspirin 81 MG EC tablet Take 2 tablets (162 mg total) by mouth daily. Swallow whole. 180 tablet 2  . multivitamin (VIT W/EXTRA C) CHEW chewable tablet Chew 12 tablets by mouth daily.    . promethazine (PHENERGAN) 25 MG tablet Take 1 tablet (25 mg total) by mouth every 6 (six) hours as needed for nausea or vomiting. 30 tablet 1  . sertraline (ZOLOFT) 50 MG tablet Take 1 tablet (50 mg total) by mouth daily. 90 tablet 3  . albuterol (VENTOLIN HFA) 108 (90 Base) MCG/ACT inhaler Inhale 2 puffs into the lungs every 6 (six) hours as needed for wheezing or shortness of breath. (Patient not taking: Reported on 07/31/2020)    . Blood Pressure Monitor MISC For regular home bp monitoring during pregnancy 1 each 0  . hydrOXYzine (ATARAX/VISTARIL) 25 MG tablet Take 25 mg by mouth daily as needed for itching (allergies).    . Nutritional Supplements (PEDIASURE GROW & GAIN) LIQD Take 1 Bottle by mouth in the morning and at bedtime. 237 mL 2     ROS Pertinent positives and negative per HPI, all others reviewed and negative  Physical Exam   Ht 5\' 2"  (1.575 m)   Wt 115.9 kg   LMP 02/09/2020   BMI 46.75 kg/m   Patient Vitals for the past 24 hrs:  Height Weight  08/01/20 1744 5\' 2"  (1.575 m) 115.9 kg    Physical Exam Vitals reviewed.  Constitutional:      General: She is not in acute distress.    Appearance: She is well-developed. She is not diaphoretic.  Eyes:     General: No scleral icterus. Pulmonary:     Effort: Pulmonary effort is normal. No respiratory distress.  Abdominal:     General: There is no distension.     Palpations: Abdomen is soft.     Tenderness: There is no abdominal tenderness. There is no guarding or rebound.  Genitourinary:    Comments: Speculum exam unremarkable. No blood  seen on external or internal exam, normal cervix with closed os. No discharge.  Skin:    General: Skin is warm and dry.  Neurological:     Mental Status: She is alert.  Coordination: Coordination normal.      Cervical Exam    Bedside Ultrasound Not done  My interpretation: n/a  FHT Baseline 145, moderate variability, +accels, no decels Toco: flat Cat: I  Labs Results for orders placed or performed during the hospital encounter of 08/01/20 (from the past 24 hour(s))  Wet prep, genital     Status: Abnormal   Collection Time: 08/01/20  6:56 PM   Specimen: Urine, Clean Catch  Result Value Ref Range   Yeast Wet Prep HPF POC NONE SEEN NONE SEEN   Trich, Wet Prep NONE SEEN NONE SEEN   Clue Cells Wet Prep HPF POC NONE SEEN NONE SEEN   WBC, Wet Prep HPF POC MANY (A) NONE SEEN   Sperm NONE SEEN   Urinalysis, Routine w reflex microscopic Urine, Clean Catch     Status: Abnormal   Collection Time: 08/01/20  6:57 PM  Result Value Ref Range   Color, Urine YELLOW YELLOW   APPearance CLOUDY (A) CLEAR   Specific Gravity, Urine 1.024 1.005 - 1.030   pH 6.0 5.0 - 8.0   Glucose, UA NEGATIVE NEGATIVE mg/dL   Hgb urine dipstick NEGATIVE NEGATIVE   Bilirubin Urine NEGATIVE NEGATIVE   Ketones, ur NEGATIVE NEGATIVE mg/dL   Protein, ur NEGATIVE NEGATIVE mg/dL   Nitrite NEGATIVE NEGATIVE   Leukocytes,Ua SMALL (A) NEGATIVE   RBC / HPF 0-5 0 - 5 RBC/hpf   WBC, UA 11-20 0 - 5 WBC/hpf   Bacteria, UA FEW (A) NONE SEEN   Squamous Epithelial / LPF 21-50 0 - 5   Mucus PRESENT     Imaging No results found.  MAU Course  Procedures  Lab Orders     Wet prep, genital     Urinalysis, Routine w reflex microscopic Urine, Clean Catch Meds ordered this encounter  Medications  . rho (d) immune globulin (RHIG/RHOPHYLAC) injection 300 mcg   Imaging Orders  No imaging studies ordered today    MDM mild  Assessment and Plan  #Vaginal bleeding #Round ligament pain No blood seen on exam. No  cuts seen. Wet prep negative. No contractions. Back pain c/w MSK etiology, RLQ pain c/w round ligament pain. Low suspicion for serious pathology such as abruption. Reassured patient, return precautions reviewed.   #Rh neg Despite lack of blood on exam, given report of bleeding at home will give rhogam as risk benefit ratio is favorable.    #FWB FHT Cat I NST: reactive  Discharged to home in stable condition.   Venora Maples, MD/MPH 08/01/20 8:03 PM  Allergies as of 08/01/2020      Reactions   Amoxicillin Hives   Drug Ingredient [azithromycin] Hives   Erythromycin Hives   Gentamicin Hives   Metronidazole Hives   Penicillins Hives   Latex Rash      Medication List    TAKE these medications   acetaminophen 500 MG tablet Commonly known as: TYLENOL Take 500 mg by mouth every 6 (six) hours as needed.   albuterol 108 (90 Base) MCG/ACT inhaler Commonly known as: VENTOLIN HFA Inhale 2 puffs into the lungs every 6 (six) hours as needed for wheezing or shortness of breath.   aspirin 81 MG EC tablet Take 2 tablets (162 mg total) by mouth daily. Swallow whole.   Blood Pressure Monitor Misc For regular home bp monitoring during pregnancy   hydrOXYzine 25 MG tablet Commonly known as: ATARAX/VISTARIL Take 25 mg by mouth daily as needed for itching (allergies).   multivitamin Chew chewable  tablet Chew 12 tablets by mouth daily.   PediaSure Grow & Gain Liqd Take 1 Bottle by mouth in the morning and at bedtime.   promethazine 25 MG tablet Commonly known as: PHENERGAN Take 1 tablet (25 mg total) by mouth every 6 (six) hours as needed for nausea or vomiting.   sertraline 50 MG tablet Commonly known as: Zoloft Take 1 tablet (50 mg total) by mouth daily.

## 2020-08-02 LAB — RH IG WORKUP (INCLUDES ABO/RH)
ABO/RH(D): A NEG
Antibody Screen: NEGATIVE
Gestational Age(Wks): 24
Unit division: 0

## 2020-08-02 LAB — GC/CHLAMYDIA PROBE AMP (~~LOC~~) NOT AT ARMC
Chlamydia: NEGATIVE
Comment: NEGATIVE
Comment: NORMAL
Neisseria Gonorrhea: NEGATIVE

## 2020-09-03 ENCOUNTER — Encounter: Payer: Medicaid Other | Admitting: Obstetrics & Gynecology

## 2020-09-03 ENCOUNTER — Other Ambulatory Visit: Payer: Medicaid Other

## 2020-09-04 ENCOUNTER — Other Ambulatory Visit: Payer: Self-pay

## 2020-09-04 ENCOUNTER — Inpatient Hospital Stay (HOSPITAL_COMMUNITY)
Admission: AD | Admit: 2020-09-04 | Discharge: 2020-09-04 | Disposition: A | Discharge status: Home / Self Care | Payer: Medicaid Other | Attending: Obstetrics & Gynecology | Admitting: Obstetrics & Gynecology

## 2020-09-04 ENCOUNTER — Encounter (HOSPITAL_COMMUNITY): Payer: Self-pay | Admitting: Obstetrics & Gynecology

## 2020-09-04 DIAGNOSIS — R102 Pelvic and perineal pain: Secondary | ICD-10-CM | POA: Diagnosis present

## 2020-09-04 DIAGNOSIS — O26893 Other specified pregnancy related conditions, third trimester: Secondary | ICD-10-CM

## 2020-09-04 DIAGNOSIS — Z3A29 29 weeks gestation of pregnancy: Secondary | ICD-10-CM | POA: Diagnosis not present

## 2020-09-04 DIAGNOSIS — Z7982 Long term (current) use of aspirin: Secondary | ICD-10-CM | POA: Diagnosis not present

## 2020-09-04 DIAGNOSIS — Z87891 Personal history of nicotine dependence: Secondary | ICD-10-CM | POA: Insufficient documentation

## 2020-09-04 DIAGNOSIS — Z881 Allergy status to other antibiotic agents status: Secondary | ICD-10-CM | POA: Insufficient documentation

## 2020-09-04 DIAGNOSIS — Z880 Allergy status to penicillin: Secondary | ICD-10-CM | POA: Diagnosis not present

## 2020-09-04 DIAGNOSIS — O26899 Other specified pregnancy related conditions, unspecified trimester: Secondary | ICD-10-CM

## 2020-09-04 HISTORY — DX: Gestational diabetes mellitus in pregnancy, unspecified control: O24.419

## 2020-09-04 HISTORY — DX: Gestational (pregnancy-induced) hypertension without significant proteinuria, unspecified trimester: O13.9

## 2020-09-04 LAB — URINALYSIS, ROUTINE W REFLEX MICROSCOPIC
Bilirubin Urine: NEGATIVE
Glucose, UA: NEGATIVE mg/dL
Hgb urine dipstick: NEGATIVE
Ketones, ur: NEGATIVE mg/dL
Nitrite: NEGATIVE
Protein, ur: NEGATIVE mg/dL
Specific Gravity, Urine: 1.019 (ref 1.005–1.030)
pH: 6 (ref 5.0–8.0)

## 2020-09-04 MED ORDER — CYCLOBENZAPRINE HCL 10 MG PO TABS: 10.0000 mg | ORAL_TABLET | Freq: Two times a day (BID) | ORAL | 0 refills | Status: DC | PRN

## 2020-09-04 MED ORDER — CYCLOBENZAPRINE HCL 5 MG PO TABS
10.0000 mg | ORAL_TABLET | Freq: Once | ORAL | Status: AC
  Administered 2020-09-04: 10 mg via ORAL
  Filled 2020-09-04: qty 2

## 2020-09-04 NOTE — MAU Note (Signed)
..  Kimberly Houston is a 25 y.o. at [redacted]w[redacted]d here in MAU reporting: Pelvic pain that is sharp and stabbing on her c-section scar.  abdominal pain that radiates to her back that began 2 days ago.  Today she has felt dizziness, blurred, and spots in her vision. Also shortness of breath. Denies fetal movement for the past 4-5 hours.  Onset of complaint:  Pain score: 10/10 for eveything Vitals:   09/04/20 2052  BP: 127/62  Pulse: 86  Resp: 20  Temp: 98.4 F (36.9 C)  SpO2: 100%     FHT: 150 Lab orders placed from triage: UA

## 2020-09-04 NOTE — MAU Provider Note (Signed)
History     CSN: [id number]  Arrival date and time: 09/04/20 2034   Event Date/Time   First Provider Initiated Contact with Patient 09/04/20 2141      Chief Complaint  Patient presents with  . Pelvic Pain  . Abdominal Pain   HPI Kimberly Houston is a 25 y.o. G2P1001 at [redacted]w[redacted]d who presents with lower abdominal pain. She rates the pain a 10/10 and states it is sharp and shooting. She states it happens when she goes from lying to sitting or sitting to standing. She denies any tightening in her abdomen. Denies any bleeding or leaking. Reports normal fetal movement.   OB History    Gravida  2   Para  1   Term  1   Preterm      AB      Living  1     SAB      IAB      Ectopic      Multiple      Live Births  1           Past Medical History:  Diagnosis Date  . Anxiety   . Asthma   . Depression   . Gestational diabetes   . Pregnancy induced hypertension     Past Surgical History:  Procedure Laterality Date  . c section    . CESAREAN SECTION    . TYMPANOSTOMY TUBE PLACEMENT  2018    Family History  Problem Relation Age of Onset  . Heart attack Paternal Grandfather   . COPD Paternal Grandmother   . Asthma Paternal Grandmother   . Hypertension Maternal Grandfather   . Diabetes Maternal Grandfather   . Seizures Father   . Alcohol abuse Father   . Other Mother        deg. disc disease    Social History   Tobacco Use  . Smoking status: Former Smoker    Packs/day: 0.50    Years: 6.00    Pack years: 3.00    Types: Cigarettes  . Smokeless tobacco: Never Used  . Tobacco comment: smokes 1 cig daily  Vaping Use  . Vaping Use: Some days  Substance Use Topics  . Alcohol use: Not Currently    Comment: "occasionally every now and then I will drink a wine cooler" - as of 08/01/2020  . Drug use: Not Currently    Allergies:  Allergies  Allergen Reactions  . Amoxicillin Hives  . Drug Ingredient [Azithromycin] Hives  . Erythromycin Hives  .  Gentamicin Hives  . Metronidazole Hives  . Penicillins Hives  . Latex Rash    Medications Prior to Admission  Medication Sig Dispense Refill Last Dose  . acetaminophen (TYLENOL) 500 MG tablet Take 500 mg by mouth every 6 (six) hours as needed.   09/03/2020 at Unknown time  . aspirin 81 MG EC tablet Take 2 tablets (162 mg total) by mouth daily. Swallow whole. 180 tablet 2 09/04/2020 at Unknown time  . promethazine (PHENERGAN) 25 MG tablet Take 1 tablet (25 mg total) by mouth every 6 (six) hours as needed for nausea or vomiting. 30 tablet 1 09/04/2020 at Unknown time  . sertraline (ZOLOFT) 50 MG tablet Take 1 tablet (50 mg total) by mouth daily. 90 tablet 3 09/04/2020 at Unknown time  . albuterol (VENTOLIN HFA) 108 (90 Base) MCG/ACT inhaler Inhale 2 puffs into the lungs every 6 (six) hours as needed for wheezing or shortness of breath. (Patient not taking: Reported on 07/31/2020)     .  Blood Pressure Monitor MISC For regular home bp monitoring during pregnancy 1 each 0   . hydrOXYzine (ATARAX/VISTARIL) 25 MG tablet Take 25 mg by mouth daily as needed for itching (allergies).     . multivitamin (VIT W/EXTRA C) CHEW chewable tablet Chew 12 tablets by mouth daily.     . Nutritional Supplements (PEDIASURE GROW & GAIN) LIQD Take 1 Bottle by mouth in the morning and at bedtime. 237 mL 2     Review of Systems  Constitutional: Negative.  Negative for fatigue and fever.  HENT: Negative.   Respiratory: Negative.  Negative for shortness of breath.   Cardiovascular: Negative.  Negative for chest pain.  Gastrointestinal: Positive for abdominal pain. Negative for constipation, diarrhea, nausea and vomiting.  Genitourinary: Negative.  Negative for dysuria, vaginal bleeding and vaginal discharge.  Neurological: Negative.  Negative for dizziness and headaches.   Physical Exam   Blood pressure 127/62, pulse 86, temperature 98.4 F (36.9 C), temperature source Oral, resp. rate 20, height 5\' 2"  (1.575 m), weight  118.9 kg, last menstrual period 02/09/2020, SpO2 100 %.  Physical Exam Vitals and nursing note reviewed.  Constitutional:      General: She is not in acute distress.    Appearance: She is well-developed.  HENT:     Head: Normocephalic.  Eyes:     Pupils: Pupils are equal, round, and reactive to light.  Cardiovascular:     Rate and Rhythm: Normal rate and regular rhythm.     Heart sounds: Normal heart sounds.  Pulmonary:     Effort: Pulmonary effort is normal. No respiratory distress.     Breath sounds: Normal breath sounds.  Abdominal:     General: Bowel sounds are normal. There is no distension.     Palpations: Abdomen is soft.     Tenderness: There is no abdominal tenderness.  Skin:    General: Skin is warm and dry.  Neurological:     Mental Status: She is alert and oriented to person, place, and time.  Psychiatric:        Behavior: Behavior normal.        Thought Content: Thought content normal.        Judgment: Judgment normal.    Fetal Tracing:  Baseline: 140 Variability: moderate Accels: 15x15 Decels: none  Toco: none   Cervix: closed/thick/posterior   MAU Course  Procedures Results for orders placed or performed during the hospital encounter of 09/04/20 (from the past 24 hour(s))  Urinalysis, Routine w reflex microscopic     Status: Abnormal   Collection Time: 09/04/20  9:27 PM  Result Value Ref Range   Color, Urine YELLOW YELLOW   APPearance HAZY (A) CLEAR   Specific Gravity, Urine 1.019 1.005 - 1.030   pH 6.0 5.0 - 8.0   Glucose, UA NEGATIVE NEGATIVE mg/dL   Hgb urine dipstick NEGATIVE NEGATIVE   Bilirubin Urine NEGATIVE NEGATIVE   Ketones, ur NEGATIVE NEGATIVE mg/dL   Protein, ur NEGATIVE NEGATIVE mg/dL   Nitrite NEGATIVE NEGATIVE   Leukocytes,Ua TRACE (A) NEGATIVE   RBC / HPF 0-5 0 - 5 RBC/hpf   WBC, UA 6-10 0 - 5 WBC/hpf   Bacteria, UA RARE (A) NONE SEEN   Squamous Epithelial / LPF 6-10 0 - 5   Mucus PRESENT    MDM UA Flexeril PO-  patient reports significant improvement in pain and requesting discharge  Encouraged patient to use pregnancy support belt daily.  Assessment and Plan   1. Pain of round  ligament during pregnancy   2. [redacted] weeks gestation of pregnancy    -Discharge home in stable condition -Rx for flexeril sent to patient's pharmacy -Abdominal pain precautions discussed -Patient advised to follow-up with OB as scheduled for prenatal care -Patient may return to MAU as needed or if her condition were to change or worsen   Rolm Bookbinder CNM 09/04/2020, 9:41 PM

## 2020-09-04 NOTE — Discharge Instructions (Signed)

## 2020-09-13 ENCOUNTER — Other Ambulatory Visit: Payer: Medicaid Other

## 2020-09-13 ENCOUNTER — Other Ambulatory Visit: Payer: Self-pay

## 2020-09-13 ENCOUNTER — Ambulatory Visit: Payer: Medicaid Other | Admitting: Obstetrics & Gynecology

## 2020-09-13 DIAGNOSIS — Z3480 Encounter for supervision of other normal pregnancy, unspecified trimester: Secondary | ICD-10-CM

## 2020-09-13 DIAGNOSIS — Z131 Encounter for screening for diabetes mellitus: Secondary | ICD-10-CM

## 2020-09-13 DIAGNOSIS — Z3A30 30 weeks gestation of pregnancy: Secondary | ICD-10-CM

## 2020-09-17 LAB — AB SCR+ANTIBODY ID: Antibody Screen: POSITIVE — AB

## 2020-09-17 LAB — CBC
Hematocrit: 34.9 % (ref 34.0–46.6)
Hemoglobin: 11.8 g/dL (ref 11.1–15.9)
MCH: 30.3 pg (ref 26.6–33.0)
MCHC: 33.8 g/dL (ref 31.5–35.7)
MCV: 90 fL (ref 79–97)
Platelets: 342 10*3/uL (ref 150–450)
RBC: 3.89 x10E6/uL (ref 3.77–5.28)
RDW: 13.1 % (ref 11.7–15.4)
WBC: 9.6 10*3/uL (ref 3.4–10.8)

## 2020-09-17 LAB — ANTIBODY SCREEN

## 2020-09-17 LAB — GLUCOSE TOLERANCE, 2 HOURS W/ 1HR
Glucose, 1 hour: 203 mg/dL — ABNORMAL HIGH (ref 65–179)
Glucose, 2 hour: 117 mg/dL (ref 65–152)
Glucose, Fasting: 81 mg/dL (ref 65–91)

## 2020-09-17 LAB — RPR: RPR Ser Ql: NONREACTIVE

## 2020-09-17 LAB — HIV ANTIBODY (ROUTINE TESTING W REFLEX): HIV Screen 4th Generation wRfx: NONREACTIVE

## 2020-09-26 ENCOUNTER — Other Ambulatory Visit: Payer: Self-pay | Admitting: Women's Health

## 2020-09-26 DIAGNOSIS — L299 Pruritus, unspecified: Secondary | ICD-10-CM

## 2020-09-26 DIAGNOSIS — Z3A32 32 weeks gestation of pregnancy: Secondary | ICD-10-CM

## 2020-09-27 ENCOUNTER — Other Ambulatory Visit (INDEPENDENT_AMBULATORY_CARE_PROVIDER_SITE_OTHER): Payer: Self-pay

## 2020-09-29 LAB — COMPREHENSIVE METABOLIC PANEL
ALT: 20 IU/L (ref 0–32)
AST: 19 IU/L (ref 0–40)
Albumin/Globulin Ratio: 1.3 (ref 1.2–2.2)
Albumin: 3.3 g/dL — ABNORMAL LOW (ref 3.9–5.0)
Alkaline Phosphatase: 116 IU/L (ref 44–121)
BUN/Creatinine Ratio: 9 (ref 9–23)
BUN: 6 mg/dL (ref 6–20)
Bilirubin Total: 0.6 mg/dL (ref 0.0–1.2)
CO2: 19 mmol/L — ABNORMAL LOW (ref 20–29)
Calcium: 9 mg/dL (ref 8.7–10.2)
Chloride: 99 mmol/L (ref 96–106)
Creatinine, Ser: 0.69 mg/dL (ref 0.57–1.00)
Globulin, Total: 2.5 g/dL (ref 1.5–4.5)
Glucose: 74 mg/dL (ref 65–99)
Potassium: 4.4 mmol/L (ref 3.5–5.2)
Sodium: 134 mmol/L (ref 134–144)
Total Protein: 5.8 g/dL — ABNORMAL LOW (ref 6.0–8.5)
eGFR: 126 mL/min/{1.73_m2} (ref >59)

## 2020-09-29 LAB — BILE ACIDS, TOTAL: Bile Acids Total: 2.3 umol/L (ref 0.0–10.0)

## 2020-10-03 ENCOUNTER — Encounter: Payer: Self-pay | Admitting: Obstetrics & Gynecology

## 2020-10-03 ENCOUNTER — Ambulatory Visit (INDEPENDENT_AMBULATORY_CARE_PROVIDER_SITE_OTHER): Payer: Medicaid Other | Admitting: Obstetrics & Gynecology

## 2020-10-03 ENCOUNTER — Other Ambulatory Visit: Payer: Self-pay

## 2020-10-03 ENCOUNTER — Encounter: Payer: Medicaid Other | Admitting: Advanced Practice Midwife

## 2020-10-03 VITALS — BP 118/78 | HR 89 | Wt 262.0 lb

## 2020-10-03 DIAGNOSIS — O24419 Gestational diabetes mellitus in pregnancy, unspecified control: Secondary | ICD-10-CM | POA: Insufficient documentation

## 2020-10-03 DIAGNOSIS — O24410 Gestational diabetes mellitus in pregnancy, diet controlled: Secondary | ICD-10-CM

## 2020-10-03 DIAGNOSIS — O0993 Supervision of high risk pregnancy, unspecified, third trimester: Secondary | ICD-10-CM

## 2020-10-03 LAB — POCT GLUCOSE (DEVICE FOR HOME USE): POC Glucose: 91 mg/dl (ref 70–99)

## 2020-10-03 MED ORDER — ACCU-CHEK GUIDE VI STRP: ORAL_STRIP | 12 refills | Status: DC

## 2020-10-03 MED ORDER — ACCU-CHEK SOFTCLIX LANCETS MISC
12 refills | Status: DC
Start: 2020-10-03 — End: 2020-11-13

## 2020-10-03 MED ORDER — ACCU-CHEK GUIDE ME W/DEVICE KIT: 1.0000 | PACK | Freq: Four times a day (QID) | 0 refills | Status: DC

## 2020-10-03 NOTE — Progress Notes (Signed)
HIGH-RISK PREGNANCY VISIT Patient name: Kimberly Houston MRN [id number]  Date of birth: [birth year] Chief Complaint:   Routine Prenatal Visit  History of Present Illness:   Kimberly Houston is a 25 y.o. G54P1001 female at [redacted]w[redacted]d with an Estimated Date of Delivery: 11/15/20 being seen today for ongoing management of a high-risk pregnancy complicated by diabetes mellitus A1DM.  Today she reports no complaints.  Depression screen [facility] 2/9 09/13/2020 05/06/2020 04/17/2020  Decreased Interest 0 0 0  Down, Depressed, Hopeless 0 0 0  PHQ - 2 Score 0 0 0  Altered sleeping 0 3 2  Tired, decreased energy 0 1 2  Change in appetite 0 1 0  Feeling bad or failure about yourself  0 0 0  Trouble concentrating 0 3 0  Moving slowly or fidgety/restless 0 0 0  Suicidal thoughts 0 0 0  PHQ-9 Score 0 8 4    Contractions: Irritability. Vag. Bleeding: None.  Movement: Present. denies leaking of fluid.  Review of Systems:   Pertinent items are noted in HPI Denies abnormal vaginal discharge w/ itching/odor/irritation, headaches, visual changes, shortness of breath, chest pain, abdominal pain, severe nausea/vomiting, or problems with urination or bowel movements unless otherwise stated above. Pertinent History Reviewed:  Reviewed past medical,surgical, social, obstetrical and family history.  Reviewed problem list, medications and allergies. Physical Assessment:   Vitals:   10/03/20 1437  BP: 118/78  Pulse: 89  Weight: 262 lb (118.8 kg)  Body mass index is 47.92 kg/m.           Physical Examination:   General appearance: alert, well appearing, and in no distress  Mental status: alert, oriented to person, place, and time  Skin: warm & dry   Extremities: Edema: Trace    Cardiovascular: normal heart rate noted  Respiratory: normal respiratory effort, no distress  Abdomen: gravid, soft, non-tender  Pelvic: Cervical exam deferred         Fetal Status:     Movement: Present    Fetal Surveillance  Testing today:    Chaperone: N/A    No results found for this or any previous visit (from the past 24 hour(s)).  Assessment & Plan:  High-risk pregnancy: G2P1001 at [redacted]w[redacted]d with an Estimated Date of Delivery: 11/15/20   1) A1DM, stable, will begin checking her sugars now, did not see the test result message sent    Meds:  Meds ordered this encounter  Medications  . Accu-Chek Softclix Lancets lancets    Sig: Use as instructed to check blood sugar 4 times daily    Dispense:  100 each    Refill:  12  . glucose blood (ACCU-CHEK GUIDE) test strip    Sig: Use as instructed to check blood sugar 4 times daily    Dispense:  50 each    Refill:  12  . Blood Glucose Monitoring Suppl (ACCU-CHEK GUIDE ME) w/Device KIT    Sig: 1 each by Does not apply route 4 (four) times daily.    Dispense:  1 kit    Refill:  0    Please use [id number]  [id number]   [id number]  [id number]    [id number]    Labs/procedures today: none  Treatment Plan:  2 weeks  Reviewed: Preterm labor symptoms and general obstetric precautions including but not limited to vaginal bleeding, contractions, leaking of fluid and fetal movement were reviewed in detail with the patient.  All questions were answered. Does have home bp cuff. Office bp cuff given: not applicable. Check  bp daily, let us know if consistently >140 and/or >90.  Follow-up: No follow-ups on file.   No future appointments.  No orders of the defined types were placed in this encounter.  Florian Buff  10/03/2020 2:55 PM

## 2020-10-04 ENCOUNTER — Inpatient Hospital Stay (HOSPITAL_COMMUNITY)
Admission: AD | Admit: 2020-10-04 | Discharge: 2020-10-04 | Disposition: A | Discharge status: Home / Self Care | Payer: Medicaid Other | Attending: Obstetrics & Gynecology | Admitting: Obstetrics & Gynecology

## 2020-10-04 ENCOUNTER — Encounter (HOSPITAL_COMMUNITY): Payer: Self-pay | Admitting: Obstetrics & Gynecology

## 2020-10-04 ENCOUNTER — Other Ambulatory Visit: Payer: Self-pay

## 2020-10-04 DIAGNOSIS — O0993 Supervision of high risk pregnancy, unspecified, third trimester: Secondary | ICD-10-CM

## 2020-10-04 DIAGNOSIS — Z3A34 34 weeks gestation of pregnancy: Secondary | ICD-10-CM | POA: Diagnosis not present

## 2020-10-04 DIAGNOSIS — Z3689 Encounter for other specified antenatal screening: Secondary | ICD-10-CM

## 2020-10-04 DIAGNOSIS — Z87891 Personal history of nicotine dependence: Secondary | ICD-10-CM | POA: Insufficient documentation

## 2020-10-04 DIAGNOSIS — O2343 Unspecified infection of urinary tract in pregnancy, third trimester: Secondary | ICD-10-CM | POA: Diagnosis not present

## 2020-10-04 DIAGNOSIS — O24410 Gestational diabetes mellitus in pregnancy, diet controlled: Secondary | ICD-10-CM

## 2020-10-04 LAB — URINALYSIS, ROUTINE W REFLEX MICROSCOPIC
Bilirubin Urine: NEGATIVE
Glucose, UA: 50 mg/dL — AB
Hgb urine dipstick: NEGATIVE
Ketones, ur: NEGATIVE mg/dL
Nitrite: POSITIVE — AB
Protein, ur: NEGATIVE mg/dL
Specific Gravity, Urine: 1.028 (ref 1.005–1.030)
pH: 6 (ref 5.0–8.0)

## 2020-10-04 LAB — CBC
HCT: 34.2 % — ABNORMAL LOW (ref 36.0–46.0)
Hemoglobin: 11.1 g/dL — ABNORMAL LOW (ref 12.0–15.0)
MCH: 29.6 pg (ref 26.0–34.0)
MCHC: 32.5 g/dL (ref 30.0–36.0)
MCV: 91.2 fL (ref 80.0–100.0)
Platelets: 354 10*3/uL (ref 150–400)
RBC: 3.75 MIL/uL — ABNORMAL LOW (ref 3.87–5.11)
RDW: 14.1 % (ref 11.5–15.5)
WBC: 10.5 10*3/uL (ref 4.0–10.5)
nRBC: 0 % (ref 0.0–0.2)

## 2020-10-04 LAB — WET PREP, GENITAL
Clue Cells Wet Prep HPF POC: NONE SEEN
Sperm: NONE SEEN
Trich, Wet Prep: NONE SEEN
Yeast Wet Prep HPF POC: NONE SEEN

## 2020-10-04 LAB — POCT FERN TEST: POCT Fern Test: NEGATIVE

## 2020-10-04 MED ORDER — NITROFURANTOIN MONOHYD MACRO 100 MG PO CAPS: 100.0000 mg | ORAL_CAPSULE | Freq: Two times a day (BID) | ORAL | 0 refills | Status: DC

## 2020-10-04 NOTE — MAU Note (Signed)
Kimberly Houston is a 25 y.o. at [redacted]w[redacted]d here in MAU reporting: dizziness since this morning and states it has gotten worse. Also reporting back pain for the past hour. No bleeding or LOF. Having some mucus discharge. No FM today.  Onset of complaint: today  Pain score: 10/10  Vitals:   10/04/20 1608  BP: (!) 117/57  Pulse: 91  Resp: 20  Temp: 98 F (36.7 C)  SpO2: 97%     FHT: 156  Lab orders placed from triage: UA

## 2020-10-04 NOTE — Discharge Instructions (Signed)
Pregnancy and Urinary Tract Infection  A urinary tract infection (UTI) is an infection of any part of the urinary tract. This includes the kidneys, the tubes that connect your kidneys to your bladder (ureters), the bladder, and the tube that carries urine out of your body (urethra). These organs make, store, and get rid of urine in the body. Your health care provider may use other names to describe the infection. An upper UTI affects the ureters and kidneys (pyelonephritis). A lower UTI affects the bladder (cystitis) and urethra (urethritis). Most urinary tract infections are caused by bacteria in your genital area, around the entrance to your urinary tract (urethra). These bacteria grow and cause irritation and inflammation of your urinary tract. You are more likely to develop a UTI during pregnancy because the physical and hormonal changes your body goes through can make it easier for bacteria to get into your urinary tract. Your growing baby also puts pressure on your bladder and can affect urine flow. It is important to recognize and treat UTIs in pregnancy because of the risk of serious complications for both you and your baby. How does this affect me? Symptoms of a UTI include:  Needing to urinate right away (urgently).  Frequent urination or passing small amounts of urine frequently.  Pain or burning with urination.  Blood in the urine.  Urine that smells bad or unusual.  Trouble urinating.  Cloudy urine.  Pain in the abdomen or lower back.  Vaginal discharge. You may also have:  Vomiting or a decreased appetite.  Confusion.  Irritability or tiredness.  A fever.  Diarrhea. How does this affect my baby? An untreated UTI during pregnancy could lead to a kidney infection or a systemic infection, which can cause health problems that could affect your baby. Possible complications of an untreated UTI include:  Giving birth to your baby before 37 weeks of pregnancy  (premature).  Having a baby with a low birth weight.  Developing high blood pressure during pregnancy (preeclampsia).  Having a low hemoglobin level (anemia). What can I do to lower my risk? To prevent a UTI:  Go to the bathroom as soon as you feel the need. Do not hold urine for long periods of time.  Always wipe from front to back, especially after a bowel movement. Use each tissue one time when you wipe.  Empty your bladder after sex.  Keep your genital area dry.  Drink 6-10 glasses of water each day.  Do not douche or use deodorant sprays. How is this treated? Treatment for this condition may include:  Antibiotic medicines that are safe to take during pregnancy.  Other medicines to treat less common causes of UTI. Follow these instructions at home:  If you were prescribed an antibiotic medicine, take it as told by your health care provider. Do not stop using the antibiotic even if you start to feel better.  Keep all follow-up visits as told by your health care provider. This is important. Contact a health care provider if:  Your symptoms do not improve or they get worse.  You have abnormal vaginal discharge. Get help right away if you:  Have a fever.  Have nausea and vomiting.  Have back or side pain.  Feel contractions in your uterus.  Have lower belly pain.  Have a gush of fluid from your vagina.  Have blood in your urine. Summary  A urinary tract infection (UTI) is an infection of any part of the urinary tract, which includes the   kidneys, ureters, bladder, and urethra.  Most urinary tract infections are caused by bacteria in your genital area, around the entrance to your urinary tract (urethra).  You are more likely to develop a UTI during pregnancy.  If you were prescribed an antibiotic medicine, take it as told by your health care provider. Do not stop using the antibiotic even if you start to feel better. This information is not intended to  replace advice given to you by your health care provider. Make sure you discuss any questions you have with your health care provider. Document Revised: 08/26/2018 Document Reviewed: 04/07/2018 Elsevier Patient Education  2021 [employer]  

## 2020-10-04 NOTE — MAU Provider Note (Signed)
History     CSN: [id number]  Arrival date and time: 10/04/20 1524   Event Date/Time   First Provider Initiated Contact with Patient 10/04/20 1650      Chief Complaint  Patient presents with  . Decreased Fetal Movement  . Dizziness  . Back Pain   24 y.o. '@34' .0 wks presenting with LBP, LOF, ctx, and dizziness. Reports gush of clear fluid around noon today. Leaking small amt since. Had sex last night. Reports ctx since but unsure of frequency. Denies VB. Denies urinary sx. Reports dizziness when she changes position quickly. Reports +FM.   OB History    Gravida  2   Para  1   Term  1   Preterm      AB      Living  1     SAB      IAB      Ectopic      Multiple      Live Births  1           Past Medical History:  Diagnosis Date  . Anxiety   . Asthma   . Depression   . Gestational diabetes   . Pregnancy induced hypertension     Past Surgical History:  Procedure Laterality Date  . c section    . CESAREAN SECTION    . TYMPANOSTOMY TUBE PLACEMENT  2018    Family History  Problem Relation Age of Onset  . Heart attack Paternal Grandfather   . COPD Paternal Grandmother   . Asthma Paternal Grandmother   . Hypertension Maternal Grandfather   . Diabetes Maternal Grandfather   . Seizures Father   . Alcohol abuse Father   . Other Mother        deg. disc disease    Social History   Tobacco Use  . Smoking status: Former Smoker    Packs/day: 0.50    Years: 6.00    Pack years: 3.00    Types: Cigarettes  . Smokeless tobacco: Never Used  . Tobacco comment: smokes 1 cig daily  Vaping Use  . Vaping Use: Some days  Substance Use Topics  . Alcohol use: Not Currently    Comment: "occasionally every now and then I will drink a wine cooler" - as of 08/01/2020  . Drug use: Not Currently    Allergies:  Allergies  Allergen Reactions  . Amoxicillin Hives  . Drug Ingredient [Azithromycin] Hives  . Erythromycin Hives  . Gentamicin Hives  .  Metronidazole Hives  . Penicillins Hives  . Latex Rash    No medications prior to admission.    Review of Systems  Gastrointestinal: Positive for abdominal pain.  Genitourinary: Positive for vaginal discharge. Negative for dysuria, frequency, urgency and vaginal bleeding.  Musculoskeletal: Positive for back pain.  Neurological: Positive for dizziness. Negative for syncope.   Physical Exam   Blood pressure 124/65, pulse 69, temperature 98 F (36.7 C), temperature source Oral, resp. rate 20, last menstrual period 02/09/2020, SpO2 100 %.  Physical Exam Vitals and nursing note reviewed.  Constitutional:      General: She is not in acute distress.    Appearance: Normal appearance.  HENT:     Head: Normocephalic and atraumatic.  Cardiovascular:     Rate and Rhythm: Normal rate.  Pulmonary:     Effort: Pulmonary effort is normal. No respiratory distress.  Abdominal:     General: There is no distension.     Palpations: Abdomen is soft.  Tenderness: There is no abdominal tenderness.  Genitourinary:    Comments: SSE: no pool, fern neg SVE: closed/thick Musculoskeletal:        General: Normal range of motion.  Skin:    General: Skin is warm and dry.  Neurological:     General: No focal deficit present.     Mental Status: She is alert and oriented to person, place, and time.  Psychiatric:        Mood and Affect: Mood normal.        Behavior: Behavior normal.   EFM: 145 bpm, mod variability, + accels, no decels Toco: none  Results for orders placed or performed during the hospital encounter of 10/04/20 (from the past 24 hour(s))  Urinalysis, Routine w reflex microscopic Urine, Clean Catch     Status: Abnormal   Collection Time: 10/04/20  4:04 PM  Result Value Ref Range   Color, Urine YELLOW YELLOW   APPearance CLOUDY (A) CLEAR   Specific Gravity, Urine 1.028 1.005 - 1.030   pH 6.0 5.0 - 8.0   Glucose, UA 50 (A) NEGATIVE mg/dL   Hgb urine dipstick NEGATIVE NEGATIVE    Bilirubin Urine NEGATIVE NEGATIVE   Ketones, ur NEGATIVE NEGATIVE mg/dL   Protein, ur NEGATIVE NEGATIVE mg/dL   Nitrite POSITIVE (A) NEGATIVE   Leukocytes,Ua MODERATE (A) NEGATIVE   RBC / HPF 6-10 0 - 5 RBC/hpf   WBC, UA 21-50 0 - 5 WBC/hpf   Bacteria, UA FEW (A) NONE SEEN   Squamous Epithelial / LPF 11-20 0 - 5   Mucus PRESENT    Ca Oxalate Crys, UA PRESENT   Wet prep, genital     Status: Abnormal   Collection Time: 10/04/20  5:00 PM   Specimen: Cervix  Result Value Ref Range   Yeast Wet Prep HPF POC NONE SEEN NONE SEEN   Trich, Wet Prep NONE SEEN NONE SEEN   Clue Cells Wet Prep HPF POC NONE SEEN NONE SEEN   WBC, Wet Prep HPF POC MODERATE (A) NONE SEEN   Sperm NONE SEEN   CBC     Status: Abnormal   Collection Time: 10/04/20  5:16 PM  Result Value Ref Range   WBC 10.5 4.0 - 10.5 K/uL   RBC 3.75 (L) 3.87 - 5.11 MIL/uL   Hemoglobin 11.1 (L) 12.0 - 15.0 g/dL   HCT 34.2 (L) 36.0 - 46.0 %   MCV 91.2 80.0 - 100.0 fL   MCH 29.6 26.0 - 34.0 pg   MCHC 32.5 30.0 - 36.0 g/dL   RDW 14.1 11.5 - 15.5 %   Platelets 354 150 - 400 K/uL   nRBC 0.0 0.0 - 0.2 %  POCT fern test     Status: None   Collection Time: 10/04/20  7:47 PM  Result Value Ref Range   POCT Fern Test Negative = intact amniotic membranes    MAU Course  Procedures  MDM Labs ordered and reviewed. Not orthostatic or anemic. No signs of SROM or PTL. Will treat UTI. UC ordered. Stable for discharge home.   Assessment and Plan   1. [redacted] weeks gestation of pregnancy   2. Diet controlled gestational diabetes mellitus (GDM) in third trimester   3. Supervision of high risk pregnancy in third trimester   4. NST (non-stress test) reactive   5. Urinary tract infection in mother during third trimester of pregnancy    Discharge home Follow up with OB as scheduled Rx Macrobid Increase water intake Return precautions  Allergies  as of 10/04/2020      Reactions   Amoxicillin Hives   Drug Ingredient [azithromycin] Hives    Erythromycin Hives   Gentamicin Hives   Metronidazole Hives   Penicillins Hives   Latex Rash      Medication List    TAKE these medications   Accu-Chek Guide Me w/Device Kit 1 each by Does not apply route 4 (four) times daily.   Accu-Chek Guide test strip Generic drug: glucose blood Use as instructed to check blood sugar 4 times daily   Accu-Chek Softclix Lancets lancets Use as instructed to check blood sugar 4 times daily   acetaminophen 500 MG tablet Commonly known as: TYLENOL Take 500 mg by mouth every 6 (six) hours as needed.   albuterol 108 (90 Base) MCG/ACT inhaler Commonly known as: VENTOLIN HFA Inhale 2 puffs into the lungs every 6 (six) hours as needed for wheezing or shortness of breath.   aspirin 81 MG EC tablet Take 2 tablets (162 mg total) by mouth daily. Swallow whole.   Blood Pressure Monitor Misc For regular home bp monitoring during pregnancy   cyclobenzaprine 10 MG tablet Commonly known as: FLEXERIL Take 1 tablet (10 mg total) by mouth 2 (two) times daily as needed for muscle spasms.   hydrOXYzine 25 MG tablet Commonly known as: ATARAX/VISTARIL Take 25 mg by mouth daily as needed for itching (allergies).   multivitamin Chew chewable tablet Chew 12 tablets by mouth daily.   nitrofurantoin (macrocrystal-monohydrate) 100 MG capsule Commonly known as: MACROBID Take 1 capsule (100 mg total) by mouth 2 (two) times daily.   PediaSure Grow & Gain Liqd Take 1 Bottle by mouth in the morning and at bedtime.   promethazine 25 MG tablet Commonly known as: PHENERGAN Take 1 tablet (25 mg total) by mouth every 6 (six) hours as needed for nausea or vomiting.   sertraline 50 MG tablet Commonly known as: Zoloft Take 1 tablet (50 mg total) by mouth daily.       Julianne Handler, CNM 10/04/2020, 7:47 PM

## 2020-10-06 LAB — CULTURE, OB URINE

## 2020-10-07 LAB — GC/CHLAMYDIA PROBE AMP (~~LOC~~) NOT AT ARMC
Chlamydia: NEGATIVE
Comment: NEGATIVE
Comment: NORMAL
Neisseria Gonorrhea: NEGATIVE

## 2020-10-11 ENCOUNTER — Other Ambulatory Visit: Payer: Self-pay | Admitting: Obstetrics & Gynecology

## 2020-10-11 DIAGNOSIS — O24410 Gestational diabetes mellitus in pregnancy, diet controlled: Secondary | ICD-10-CM

## 2020-10-13 ENCOUNTER — Inpatient Hospital Stay (HOSPITAL_BASED_OUTPATIENT_CLINIC_OR_DEPARTMENT_OTHER): Payer: Medicaid Other

## 2020-10-13 ENCOUNTER — Encounter (HOSPITAL_COMMUNITY): Payer: Self-pay | Admitting: Family Medicine

## 2020-10-13 ENCOUNTER — Other Ambulatory Visit: Payer: Self-pay

## 2020-10-13 ENCOUNTER — Inpatient Hospital Stay (HOSPITAL_COMMUNITY)
Admission: RE | Admit: 2020-10-13 | Discharge: 2020-10-13 | Disposition: A | Discharge status: Home / Self Care | Payer: Medicaid Other | Attending: Family Medicine | Admitting: Family Medicine

## 2020-10-13 DIAGNOSIS — J45909 Unspecified asthma, uncomplicated: Secondary | ICD-10-CM | POA: Diagnosis not present

## 2020-10-13 DIAGNOSIS — Z0371 Encounter for suspected problem with amniotic cavity and membrane ruled out: Secondary | ICD-10-CM

## 2020-10-13 DIAGNOSIS — O368130 Decreased fetal movements, third trimester, not applicable or unspecified: Secondary | ICD-10-CM

## 2020-10-13 DIAGNOSIS — O09293 Supervision of pregnancy with other poor reproductive or obstetric history, third trimester: Secondary | ICD-10-CM | POA: Diagnosis not present

## 2020-10-13 DIAGNOSIS — O99343 Other mental disorders complicating pregnancy, third trimester: Secondary | ICD-10-CM | POA: Diagnosis not present

## 2020-10-13 DIAGNOSIS — O368390 Maternal care for abnormalities of the fetal heart rate or rhythm, unspecified trimester, not applicable or unspecified: Secondary | ICD-10-CM

## 2020-10-13 DIAGNOSIS — Z7982 Long term (current) use of aspirin: Secondary | ICD-10-CM | POA: Diagnosis not present

## 2020-10-13 DIAGNOSIS — O99333 Smoking (tobacco) complicating pregnancy, third trimester: Secondary | ICD-10-CM | POA: Insufficient documentation

## 2020-10-13 DIAGNOSIS — O368330 Maternal care for abnormalities of the fetal heart rate or rhythm, third trimester, not applicable or unspecified: Secondary | ICD-10-CM | POA: Diagnosis not present

## 2020-10-13 DIAGNOSIS — O2343 Unspecified infection of urinary tract in pregnancy, third trimester: Secondary | ICD-10-CM | POA: Diagnosis not present

## 2020-10-13 DIAGNOSIS — Z79899 Other long term (current) drug therapy: Secondary | ICD-10-CM | POA: Insufficient documentation

## 2020-10-13 DIAGNOSIS — O42913 Preterm premature rupture of membranes, unspecified as to length of time between rupture and onset of labor, third trimester: Secondary | ICD-10-CM

## 2020-10-13 DIAGNOSIS — O99513 Diseases of the respiratory system complicating pregnancy, third trimester: Secondary | ICD-10-CM | POA: Diagnosis not present

## 2020-10-13 DIAGNOSIS — O34219 Maternal care for unspecified type scar from previous cesarean delivery: Secondary | ICD-10-CM

## 2020-10-13 DIAGNOSIS — O4703 False labor before 37 completed weeks of gestation, third trimester: Secondary | ICD-10-CM

## 2020-10-13 DIAGNOSIS — O24410 Gestational diabetes mellitus in pregnancy, diet controlled: Secondary | ICD-10-CM | POA: Diagnosis not present

## 2020-10-13 DIAGNOSIS — O0993 Supervision of high risk pregnancy, unspecified, third trimester: Secondary | ICD-10-CM

## 2020-10-13 DIAGNOSIS — F419 Anxiety disorder, unspecified: Secondary | ICD-10-CM | POA: Insufficient documentation

## 2020-10-13 DIAGNOSIS — F32A Depression, unspecified: Secondary | ICD-10-CM | POA: Diagnosis not present

## 2020-10-13 DIAGNOSIS — O368190 Decreased fetal movements, unspecified trimester, not applicable or unspecified: Secondary | ICD-10-CM

## 2020-10-13 DIAGNOSIS — Z3A35 35 weeks gestation of pregnancy: Secondary | ICD-10-CM

## 2020-10-13 DIAGNOSIS — Z3689 Encounter for other specified antenatal screening: Secondary | ICD-10-CM

## 2020-10-13 LAB — URINALYSIS, ROUTINE W REFLEX MICROSCOPIC
Bilirubin Urine: NEGATIVE
Glucose, UA: NEGATIVE mg/dL
Hgb urine dipstick: NEGATIVE
Ketones, ur: 80 mg/dL — AB
Leukocytes,Ua: NEGATIVE
Nitrite: NEGATIVE
Protein, ur: NEGATIVE mg/dL
Specific Gravity, Urine: 1.019 (ref 1.005–1.030)
pH: 5 (ref 5.0–8.0)

## 2020-10-13 LAB — AMNISURE RUPTURE OF MEMBRANE (ROM) NOT AT ARMC: Amnisure ROM: NEGATIVE

## 2020-10-13 NOTE — MAU Note (Signed)
Kimberly Houston is a 25 y.o. at [redacted]w[redacted]d here in MAU reporting: woke up in a puddle of fluid this AM around 0830. Fluid was clear. No leaking since then. Having some contractions every 3-5 minutes. States contractions started yesterday- [place] and pain started after that. No bleeding. DFM.  Onset of complaint: today  Pain score: 5/10  Vitals:   10/13/20 1007  BP: (!) 132/55  Pulse: 97  Resp: 16  Temp: 98.2 F (36.8 C)  SpO2: 98%     FHT:156  Lab orders placed from triage: UA

## 2020-10-13 NOTE — Discharge Instructions (Signed)
https://www.ncbi.nlm.nih.gov/books/NBK532888/">  Prelabor Rupture and Preterm Prelabor Rupture of Membranes  Rupture of membranes is when the sac of fluid that surrounds a baby in the uterus (amniotic sac) breaks open. This is commonly referred to as your water breaking. If your water breaks before labor starts (prematurely), it is called prelabor rupture of membranes (PROM). If PROM occurs before 37 weeks of pregnancy, it is called preterm prelabor rupture of membranes (PPROM). Because the amniotic sac keeps infection out and performs other important functions, having the amniotic sac rupture before 37 weeks of pregnancy can lead to serious problems for the mother and the baby. These include an increased risk of:  Needing to have a cesarean delivery, or C-section.  A serious infection for both the mother and the baby.  Respiratory distress syndrome in the baby.  The baby's lungs not developing as much as normal.  Bleeding in the baby's brain.  The baby dying. PPROM requires immediate attention from a health care provider. What are the causes? When PROM occurs at 37 weeks of pregnancy or later, it is usually caused by natural weakening of the membranes and friction caused by contractions. PPROM is usually caused by infection. In many cases, the cause is not known. What increases the risk? The following factors may make you more likely to have PPROM:  Infection.  Having had PPROM in a previous pregnancy.  Having a short cervix.  Bleeding during the second or third trimester.  Low body mass index (BMI), which is an estimate of body fat.  Smoking or using drugs. What are the signs or symptoms? Signs of PROM and PPROM include:  A sudden gush or slow leaking of fluid from the vagina.  Constant wet underwear. Sometimes, women mistake the leaking or wetness for urine, especially if the leak is slow and not a gush of fluid. If there is constant leaking or if your underwear continues  to get wet, your membranes have likely ruptured. How is this diagnosed? This condition may be diagnosed based on:  Your symptoms and medical history.  A physical exam. This will include a cervical exam that is done using a lubricated instrument (speculum) to check whether the cervix has softened or started to open (dilate) and to look for amniotic fluid in the vagina.  Tests to check for the presence of amniotic fluid in the vagina. How is this treated? Treatment will depend on many factors, such as how many weeks you have been pregnant (how far along you are), the development of the baby, and other complications that may occur. Treatment may include:  Taking steps to cause you to start the labor process (labor induction). This may be done if: ? You have PROM. In this case, labor may be induced within 24 hours if you are not having contractions. ? You have PPROM that happens after you have reached the 34th week of pregnancy. Labor may be induced if you are not having contractions. ? You have PPROM that happens before the 34th week of pregnancy and complications occur for you or the baby.  Monitoring you and the baby closely for signs of infection or other complications.  Medicines, such as: ? An antibiotic to lower the chances of developing an infection. ? A steroid to help mature the baby's lungs more quickly. ? A medicine to help prevent cerebral palsy in your baby. Follow these instructions at home: You may need to stay in the hospital for treatment and monitoring. If you are allowed to go home,  follow instructions from your health care provider. Make sure you:  Rest as told by your health care provider.  Take over-the-counter and prescription medicines only as told by your health care provider.  Do not use any products that contain nicotine or tobacco, such as cigarettes, e-cigarettes, and chewing tobacco. If you need help quitting, ask your health care provider.  Do not drink  alcohol.  Return to the hospital as told by your health care provider. Contact a health care provider if:  You have a sudden gush or slow leaking of fluid from the vagina after 37 weeks of pregnancy.  You have constant wet underwear after 37 weeks of pregnancy. Get help right away if:  Your amniotic sac ruptures before 37 weeks of pregnancy.  You are being monitored at home after PROM or PPROM and you develop signs of an infection, such as fever, chills, body aches, or abdominal pain.  You have signs of labor, such as abdominal pain or menstrual-like cramping.  You have vaginal bleeding.  The color of your vaginal fluid changes from clear to green, brown, or bloody.  You see the umbilical cord sticking out from your vagina or you feel the cord in your vagina. Summary  Rupture of membranes is when the sac of fluid that surrounds a baby in the uterus (amniotic sac) breaks open.  If rupture of membranes happens after 37 weeks of pregnancy and labor has not started, it is called PROM. If it happens before 37 weeks of pregnancy, it is called PPROM.  PPROM causes an increased risk of some serious problems for you and your baby.  Follow your health care provider's instructions for when to seek help. This information is not intended to replace advice given to you by your health care provider. Make sure you discuss any questions you have with your health care provider. Document Revised: 05/25/2019 Document Reviewed: 05/25/2019 Elsevier Patient Education  2021 [employer]        Preterm Labor The normal length of a pregnancy is 39-41 weeks. Preterm labor is when labor starts before 37 completed weeks of pregnancy. Babies who are born prematurely and survive may not be fully developed and may be at an increased risk for long-term problems such as cerebral palsy, developmental delays, and vision and hearing problems. Babies who are born too early may have problems soon after birth.  Problems may include regulating blood sugar, body temperature, heart rate, and breathing rate. These babies often have trouble with feeding. The risk of having problems is highest for babies who are born before 34 weeks of pregnancy. What are the causes? The exact cause of this condition is not known. What increases the risk? You are more likely to have preterm labor if you have certain risk factors that relate to your medical history, problems with present and past pregnancies, and lifestyle factors. Medical history  You have abnormalities of the uterus, including a short cervix.  You have STIs (sexually transmitted infections), or other infections of the urinary tract and the vagina.  You have chronic illnesses, such as blood clotting problems, diabetes, or high blood pressure.  You are overweight or underweight. Present and past pregnancies  You have had preterm labor before.  You are pregnant with twins or other multiples.  You have been diagnosed with a condition in which the placenta covers your cervix (placenta previa).  You waited less than 6 months between giving birth and becoming pregnant again.  Your unborn baby has some abnormalities.  You have vaginal bleeding during pregnancy.  You became pregnant through in vitro fertilization (IVF). Lifestyle and environmental factors  You use tobacco products.  You drink alcohol.  You use street drugs.  You have stress and no social support.  You experience domestic violence.  You are exposed to certain chemicals or environmental pollutants. Other factors  You are younger than age 60 or older than age 23. What are the signs or symptoms? Symptoms of this condition include:  Cramps similar to those that can happen during a menstrual period. The cramps may happen with diarrhea.  Pain in the abdomen or lower back.  Regular contractions that may feel like tightening of the abdomen.  A feeling of increased pressure in  the pelvis.  Increased watery or bloody mucus discharge from the vagina.  Water breaking (ruptured amniotic sac). How is this diagnosed? This condition is diagnosed based on:  Your medical history and a physical exam.  A pelvic exam.  An ultrasound.  Monitoring your uterus for contractions.  Other tests, including: ? A swab of the cervix to check for a chemical called fetal fibronectin. ? Urine tests. How is this treated? Treatment for this condition depends on the length of your pregnancy, your condition, and the health of your baby. Treatment may include:  Taking medicines, such as: ? Hormone medicines. These may be given early in pregnancy to help support the pregnancy. ? Medicines to stop contractions. ? Medicines to help mature the baby's lungs. These may be prescribed if the risk of delivery is high. ? Medicines to prevent your baby from developing cerebral palsy.  Bed rest. If the labor happens before 34 weeks of pregnancy, you may need to stay in the hospital.  Delivery of the baby. Follow these instructions at home:  Do not use any products that contain nicotine or tobacco, such as cigarettes, e-cigarettes, and chewing tobacco. If you need help quitting, ask your health care provider.  Do not drink alcohol.  Take over-the-counter and prescription medicines only as told by your health care provider.  Rest as told by your health care provider.  Return to your normal activities as told by your health care provider. Ask your health care provider what activities are safe for you.  Keep all follow-up visits as told by your health care provider. This is important.   How is this prevented? To increase your chance of having a full-term pregnancy:  Do not use street drugs or medicines that have not been prescribed to you during your pregnancy.  Talk with your health care provider before taking any herbal supplements, even if you have been taking them regularly.  Make  sure you gain a healthy amount of weight during your pregnancy.  Watch for infection. If you think that you might have an infection, get it checked right away. Symptoms of infection may include: ? Fever. ? Abnormal vaginal discharge or discharge that smells bad. ? Pain or burning with urination. ? Needing to urinate urgently. ? Frequently urinating or passing small amounts of urine frequently. ? Blood in your urine. ? Urine that smells bad or unusual.  Tell your health care provider if you have had preterm labor before. Contact a health care provider if:  You think you are going into preterm labor.  You have signs or symptoms of preterm labor.  You have symptoms of infection. Get help right away if:  You are having regular, painful contractions every 5 minutes or less.  Your water breaks. Summary  Preterm labor is labor that starts before you reach 37 weeks of pregnancy.  Delivering your baby early increases your baby's risk of developing lifelong problems.  The exact cause of preterm labor is unknown. However, having an abnormal uterus, an STI (sexually transmitted infection), or vaginal bleeding during pregnancy increases your risk for preterm labor.  Keep all follow-up visits as told by your health care provider. This is important.  Contact a health care provider if you have signs or symptoms of preterm labor. This information is not intended to replace advice given to you by your health care provider. Make sure you discuss any questions you have with your health care provider. Document Revised: 06/06/2019 Document Reviewed: 06/06/2019 Elsevier Patient Education  2021 [employer]        Fetal Movement Counts Patient Name: ________________________________________________ Patient Due Date: ____________________  What is a fetal movement count? A fetal movement count is the number of times that you feel your baby move during a certain amount of time. This may also  be called a fetal kick count. A fetal movement count is recommended for every pregnant woman. You may be asked to start counting fetal movements as early as week 28 of your pregnancy. Pay attention to when your baby is most active. You may notice your baby's sleep and wake cycles. You may also notice things that make your baby move more. You should do a fetal movement count:  When your baby is normally most active.  At the same time each day. A good time to count movements is while you are resting, after having something to eat and drink. How do I count fetal movements? 1. Find a quiet, comfortable area. Sit, or lie down on your side. 2. Write down the date, the start time and stop time, and the number of movements that you felt between those two times. Take this information with you to your health care visits. 3. Write down your start time when you feel the first movement. 4. Count kicks, flutters, swishes, rolls, and jabs. You should feel at least 10 movements. 5. You may stop counting after you have felt 10 movements, or if you have been counting for 2 hours. Write down the stop time. 6. If you do not feel 10 movements in 2 hours, contact your health care provider for further instructions. Your health care provider may want to do additional tests to assess your baby's well-being. Contact a health care provider if:  You feel fewer than 10 movements in 2 hours.  Your baby is not moving like he or she usually does. Date: ____________ Start time: ____________ Stop time: ____________ Movements: ____________ Date: ____________ Start time: ____________ Stop time: ____________ Movements: ____________ Date: ____________ Start time: ____________ Stop time: ____________ Movements: ____________ Date: ____________ Start time: ____________ Stop time: ____________ Movements: ____________ Date: ____________ Start time: ____________ Stop time: ____________ Movements: ____________ Date: ____________ Start  time: ____________ Stop time: ____________ Movements: ____________ Date: ____________ Start time: ____________ Stop time: ____________ Movements: ____________ Date: ____________ Start time: ____________ Stop time: ____________ Movements: ____________ Date: ____________ Start time: ____________ Stop time: ____________ Movements: ____________ This information is not intended to replace advice given to you by your health care provider. Make sure you discuss any questions you have with your health care provider. Document Revised: 12/22/2018 Document Reviewed: 12/22/2018 Elsevier Patient Education  2021 [employer].

## 2020-10-13 NOTE — MAU Provider Note (Signed)
History     CSN: [id number]  Arrival date and time: 10/13/20 5638   Event Date/Time   First Provider Initiated Contact with Patient 10/13/20 1224      Chief Complaint  Patient presents with  . Rupture of Membranes  . Contractions  . Decreased Fetal Movement   Kimberly Houston is a 25 y.o. G2P1001 at 34w2dwho presents to MAU for possible ROM. Patient reports she was in bed on the phone with her mom and all of a sudden was lying in a puddle of water. Patient reports she got up to use the bathroom and reports no additional leaking after that time. Patient reports the fluid was "clear and slimy" and did not have an odor, but pt reports she did not try to smell it.  Patient also reports ctx q2-5 min and reports stomach will get tight across her entire stomach, and she will feel the pain and have to stop what she is doing. Provider at bedside when patient reports ctx, but ctx unable to be palpated. Patient reports the pains started last [place] park, which is not normal for her, as she usually has a very sedentary lifestyle. Patient reports she took Tylenol for pain last night because her legs hurt from walking and she took a benadryl to "help her relax and go to bed." Patient reports taking 10037mof Tylenol last night, which did help her leg pain, but did not help with the abdominal pain.  Patient also reports she was diagnosed with a UTI about 2 weeks ago and she has been drinking cranberry juice for treatment as she has not been able to afford the $3 for her antibiotics.   Pt denies VB, LOF, decreased FM, vaginal odor/itching. Pt denies N/V, abdominal pain, constipation, diarrhea, or urinary problems. Pt denies fever, chills, fatigue, sweating or changes in appetite. Pt denies SOB or chest pain. Pt denies dizziness, HA, light-headedness, weakness.  Problems this pregnancy include: GDM, asthma,  smoking, hx C/S x1, RH negative, anxiety/depression, false+ RPR, fetal renal pelvic dilation. Allergies? AMOX, azithromycin, erythromycin, gentamycin, metronidazole, PCN, latex [facility]ren[facility] care provider? [facility], next appt 10/15/2020   OB History    Gravida  2   Para  1   Term  1   Preterm      AB      Living  1     SAB      IAB      Ectopic      Multiple      Live Births  1           Past Medical History:  Diagnosis Date  . Anxiety   . Asthma   . Depression   . Gestational diabetes   . Pregnancy induced hypertension     Past Surgical History:  Procedure Laterality Date  . c section    . CESAREAN SECTION    . TYMPANOSTOMY TUBE PLACEMENT  2018    Family History  Problem Relation Age of Onset  . Heart attack Paternal Grandfather   . COPD Paternal Grandmother   . Asthma Paternal Grandmother   . Hypertension Maternal Grandfather   . Diabetes Maternal Grandfather   . Seizures Father   . Alcohol abuse Father   . Other Mother        deg. disc disease    Social History   Tobacco Use  . Smoking status:  Former Smoker    Packs/day: 0.50    Years: 6.00    Pack years: 3.00    Types: Cigarettes  . Smokeless tobacco: Never Used  . Tobacco comment: smokes 1 cig daily  Vaping Use  . Vaping Use: Some days  Substance Use Topics  . Alcohol use: Not Currently    Comment: "occasionally every now and then I will drink a wine cooler" - as of 08/01/2020  . Drug use: Not Currently    Allergies:  Allergies  Allergen Reactions  . Amoxicillin Hives  . Drug Ingredient [Azithromycin] Hives  . Erythromycin Hives  . Gentamicin Hives  . Metronidazole Hives  . Penicillins Hives  . Latex Rash    Medications Prior to Admission  Medication Sig Dispense Refill Last Dose  . Accu-Chek Softclix Lancets lancets Use as instructed to check blood sugar 4 times daily 100 each 12   . acetaminophen (TYLENOL) 500 MG tablet Take 500 mg by mouth every 6 (six) hours as  needed.     Marland Kitchen albuterol (VENTOLIN HFA) 108 (90 Base) MCG/ACT inhaler Inhale 2 puffs into the lungs every 6 (six) hours as needed for wheezing or shortness of breath.     Marland Kitchen aspirin 81 MG EC tablet Take 2 tablets (162 mg total) by mouth daily. Swallow whole. 180 tablet 2   . Blood Glucose Monitoring Suppl (ACCU-CHEK GUIDE ME) w/Device KIT 1 each by Does not apply route 4 (four) times daily. 1 kit 0   . Blood Pressure Monitor MISC For regular home bp monitoring during pregnancy 1 each 0   . cyclobenzaprine (FLEXERIL) 10 MG tablet Take 1 tablet (10 mg total) by mouth 2 (two) times daily as needed for muscle spasms. 20 tablet 0   . glucose blood (ACCU-CHEK GUIDE) test strip Use as instructed to check blood sugar 4 times daily 50 each 12   . hydrOXYzine (ATARAX/VISTARIL) 25 MG tablet Take 25 mg by mouth daily as needed for itching (allergies).     . multivitamin (VIT W/EXTRA C) CHEW chewable tablet Chew 12 tablets by mouth daily.     . nitrofurantoin, macrocrystal-monohydrate, (MACROBID) 100 MG capsule Take 1 capsule (100 mg total) by mouth 2 (two) times daily. 14 capsule 0   . Nutritional Supplements (PEDIASURE GROW & GAIN) LIQD Take 1 Bottle by mouth in the morning and at bedtime. 237 mL 2   . promethazine (PHENERGAN) 25 MG tablet Take 1 tablet (25 mg total) by mouth every 6 (six) hours as needed for nausea or vomiting. 30 tablet 1   . sertraline (ZOLOFT) 50 MG tablet Take 1 tablet (50 mg total) by mouth daily. 90 tablet 3     Review of Systems  Constitutional: Negative for chills, diaphoresis, fatigue and fever.  Eyes: Negative for visual disturbance.  Respiratory: Negative for shortness of breath.   Cardiovascular: Negative for chest pain.  Gastrointestinal: Positive for abdominal pain (ctx). Negative for constipation, diarrhea, nausea and vomiting.  Genitourinary: Positive for vaginal discharge. Negative for dysuria, flank pain, frequency, pelvic pain, urgency and vaginal bleeding.   Neurological: Negative for dizziness, weakness, light-headedness and headaches.   Physical Exam   Blood pressure 122/71, pulse (!) 102, temperature 98.2 F (36.8 C), temperature source Oral, resp. rate 16, height 5' 2" (1.575 m), weight 117.1 kg, last menstrual period 02/09/2020, SpO2 98 %.  Patient Vitals for the past 24 hrs:  BP Temp Temp src Pulse Resp SpO2 Height Weight  10/13/20 1022 122/71 -- -- (!) 102 -- -- -- --  10/13/20 1007 (!) 132/55 98.2 F (36.8 C) Oral 97 16 98 % -- --  10/13/20 1002 -- -- -- -- -- -- 5' 2" (1.575 m) 117.1 kg   Physical Exam Vitals and nursing note reviewed.  Constitutional:      General: She is not in acute distress.    Appearance: Normal appearance. She is not ill-appearing, toxic-appearing or diaphoretic.  HENT:     Head: Normocephalic and atraumatic.  Pulmonary:     Effort: Pulmonary effort is normal.  Neurological:     Mental Status: She is alert and oriented to person, place, and time.  Psychiatric:        Mood and Affect: Mood normal.        Behavior: Behavior normal.        Thought Content: Thought content normal.        Judgment: Judgment normal.    Results for orders placed or performed during the hospital encounter of 10/13/20 (from the past 24 hour(s))  Urinalysis, Routine w reflex microscopic Urine, Clean Catch     Status: Abnormal   Collection Time: 10/13/20 10:01 AM  Result Value Ref Range   Color, Urine YELLOW YELLOW   APPearance HAZY (A) CLEAR   Specific Gravity, Urine 1.019 1.005 - 1.030   pH 5.0 5.0 - 8.0   Glucose, UA NEGATIVE NEGATIVE mg/dL   Hgb urine dipstick NEGATIVE NEGATIVE   Bilirubin Urine NEGATIVE NEGATIVE   Ketones, ur 80 (A) NEGATIVE mg/dL   Protein, ur NEGATIVE NEGATIVE mg/dL   Nitrite NEGATIVE NEGATIVE   Leukocytes,Ua NEGATIVE NEGATIVE  Amnisure rupture of membrane (rom)not at [facility]     Status: None   Collection Time: 10/13/20 11:38 AM  Result Value Ref Range   Amnisure ROM NEGATIVE     [place] MFM  FETAL BPP WO NON STRESS  Result Date: 10/13/2020 ----------------------------------------------------------------------  OBSTETRICS REPORT                       (Signed Final 10/13/2020 03:17 pm) ---------------------------------------------------------------------- Patient Info  ID #:       [id number]                          D.O.B.:  1995-12-09 (24 yrs)  Name:       Kimberly Houston               Visit Date: 10/13/2020 01:12 pm ---------------------------------------------------------------------- Performed By  Attending:        Johnell Comings MD         Secondary Phy.:    [facility] MAU/Triage  Performed By:     Wilnette Kales        [facility]                                  [place]  Referred By:      Clarisa Fling ---------------------------------------------------------------------- Orders  #  Description                           Code        Ordered By  1  [place] MFM FETAL BPP WO NON  61950.93    NICOLE NUGENT     STRESS ----------------------------------------------------------------------  #  Order #                     Accession #                Episode #  1  [id number]                   [id number]                 [id number] ---------------------------------------------------------------------- Indications  Decreased fetal movement                        O36.8190  Preterm contractions                            O47.00  Leakage of amniotic fluid                       O42.90  History of cesarean delivery, currently         O43.219  pregnant  Poor obstetric history: Previous gestational    O09.299  diabetes  Poor obstetric history: Previous                O09.299  preeclampsia / eclampsia/gestational HTN  [redacted] weeks gestation of pregnancy                 Z3A.35 ---------------------------------------------------------------------- Fetal Evaluation  Num Of Fetuses:          1  Fetal Heart Rate(bpm):   120  Cardiac Activity:        Observed  Presentation:             Cephalic  Placenta:                Posterior  Amniotic Fluid  AFI FV:      Within normal limits  AFI Sum(cm)     %Tile       Largest Pocket(cm)  13              43          4.1  RUQ(cm)       RLQ(cm)       LUQ(cm)        LLQ(cm)  1.8           3.9           4.1            3.2 ---------------------------------------------------------------------- Biophysical Evaluation  Amniotic F.V:   Within normal limits       F. Tone:         Observed  F. Movement:    Observed                   Score:           8/8  F. Breathing:   Observed ---------------------------------------------------------------------- OB History  Gravidity:    2  Living:       1 ---------------------------------------------------------------------- Gestational Age  LMP:           35w 2d        Date:  02/09/20                 EDD:   11/15/20  Best:          Barbie Haggis 2d  Det. By:  LMP  (02/09/20)          EDD:   11/15/20 ---------------------------------------------------------------------- Anatomy  Ductal Arch:           Appears normal         Kidneys:                Appear normal  Stomach:               Appears normal, left   Bladder:                Appears normal                         sided  Other:  Technicallly difficult due to advanced GA and maternal habitus. ---------------------------------------------------------------------- Comments  This patient presented to the MAU due to contractions and  possible leakage of fluid.  A biophysical profile performed today was 8 out of 8.  There was normal amniotic fluid noted on today's ultrasound  exam. A possible skippped fetal heartbeat beat was noted on  the ultrasound today. This may be an artifact or may  represent premature atrial contractions. She had a reactive  NST.  Another ultrasound may be scheduled in the MFM office next  week should that be any further concerns regarding the fetal  heart rate. ----------------------------------------------------------------------                    Johnell Comings, MD Electronically Signed Final Report   10/13/2020 03:17 pm ----------------------------------------------------------------------   MAU Course  Procedures  MDM -evaluation for possible PPROM -AmniSure: negative  -evaluation for PTL per pt report of ctx -no ctx tracing on monitor -no ctx able to be palpated at bedside when pt reports ctx, and patient does not appear to be in discomfort or pain when reporting ctx to provider at bedside; pt only noted to have one contraction shortly after she was asked about it by provider, but for the remainder of the 7mnute interview, no ctx were reported by patient or observed by provider -discussed with pt need to pick up ABX for UTI ASAP, as bladder can be irritated and causing discomfort  -pt reported DFM to RN, but denied DFM to provider -EFM: reactive       -baseline: 130       -variability: moderate       -accels: present, 15x15       -decels: absent, tracing disturbed by arrythmia noted while in patient room       -TOCO: quiet -BPP: 8/8, fetal arrythmia noted by RN on EFM and by [employer] -consulted with Dr. FAnnamaria Bootswho reviewed [place] states that it is either PVCs or artifact on [place] patient can be seen in MFM office in one week for repeat [place] there be any further concerns, message sent to Dr. EElonda Huskywith findings as patient is scheduled for appt with Dr. EElonda Huskyon 10/15/2020 -pt discharged to home in stable condition  Orders Placed This Encounter  Procedures  . [place] FETAL BPP WO NON STRESS    Standing Status:   Standing    Number of Occurrences:   1    Order Specific Question:   Symptom/Reason for Exam    Answer:   Decreased fetal movement [[[id number]] . Urinalysis, Routine w reflex microscopic Urine, Clean Catch    Standing Status:   Standing    Number of Occurrences:   1  .  Amnisure rupture of membrane (rom)not at [facility]    Standing Status:   Standing    Number of Occurrences:   1  . Discharge patient    Order Specific  Question:   Discharge disposition    Answer:   01-Home or Self Care [1]    Order Specific Question:   Discharge patient date    Answer:   10/13/2020   No orders of the defined types were placed in this encounter.  Assessment and Plan   1. Encounter for suspected premature rupture of amniotic membranes, with rupture of membranes not found   2. Diet controlled gestational diabetes mellitus (GDM) in third trimester   3. Supervision of high risk pregnancy in third trimester   4. Decreased fetal movement   5. [redacted] weeks gestation of pregnancy   6. NST (non-stress test) reactive   7. Urinary tract infection in pregnancy, antepartum, third trimester   8. Fetal arrhythmia affecting pregnancy, antepartum    Allergies as of 10/13/2020      Reactions   Amoxicillin Hives   Drug Ingredient [azithromycin] Hives   Erythromycin Hives   Gentamicin Hives   Metronidazole Hives   Penicillins Hives   Latex Rash      Medication List    TAKE these medications   Accu-Chek Guide Me w/Device Kit 1 each by Does not apply route 4 (four) times daily.   Accu-Chek Guide test strip Generic drug: glucose blood Use as instructed to check blood sugar 4 times daily   Accu-Chek Softclix Lancets lancets Use as instructed to check blood sugar 4 times daily   acetaminophen 500 MG tablet Commonly known as: TYLENOL Take 500 mg by mouth every 6 (six) hours as needed.   albuterol 108 (90 Base) MCG/ACT inhaler Commonly known as: VENTOLIN HFA Inhale 2 puffs into the lungs every 6 (six) hours as needed for wheezing or shortness of breath.   aspirin 81 MG EC tablet Take 2 tablets (162 mg total) by mouth daily. Swallow whole.   Blood Pressure Monitor Misc For regular home bp monitoring during pregnancy   cyclobenzaprine 10 MG tablet Commonly known as: FLEXERIL Take 1 tablet (10 mg total) by mouth 2 (two) times daily as needed for muscle spasms.   hydrOXYzine 25 MG tablet Commonly known as:  ATARAX/VISTARIL Take 25 mg by mouth daily as needed for itching (allergies).   multivitamin Chew chewable tablet Chew 12 tablets by mouth daily.   nitrofurantoin (macrocrystal-monohydrate) 100 MG capsule Commonly known as: MACROBID Take 1 capsule (100 mg total) by mouth 2 (two) times daily.   PediaSure Grow & Gain Liqd Take 1 Bottle by mouth in the morning and at bedtime.   promethazine 25 MG tablet Commonly known as: PHENERGAN Take 1 tablet (25 mg total) by mouth every 6 (six) hours as needed for nausea or vomiting.   sertraline 50 MG tablet Commonly known as: Zoloft Take 1 tablet (50 mg total) by mouth daily.      -pt needs ride to next appt, pt has Medicaid, message sent to office to assist patient with transportation for future appointments -discussed s/sx of PPROM -discussed s/sx of PTL -return MAU precautions given -pt discharged to home in stable condition  Gerrie Nordmann Nishan Ovens 10/13/2020, 3:45 PM

## 2020-10-15 ENCOUNTER — Encounter: Payer: Self-pay | Admitting: Obstetrics & Gynecology

## 2020-10-15 ENCOUNTER — Ambulatory Visit: Payer: Medicaid Other | Admitting: Obstetrics & Gynecology

## 2020-10-15 ENCOUNTER — Ambulatory Visit (INDEPENDENT_AMBULATORY_CARE_PROVIDER_SITE_OTHER): Payer: Medicaid Other

## 2020-10-15 ENCOUNTER — Other Ambulatory Visit: Payer: Self-pay

## 2020-10-15 VITALS — BP 109/76 | HR 69 | Wt 257.0 lb

## 2020-10-15 DIAGNOSIS — Z3A35 35 weeks gestation of pregnancy: Secondary | ICD-10-CM | POA: Diagnosis not present

## 2020-10-15 DIAGNOSIS — O24410 Gestational diabetes mellitus in pregnancy, diet controlled: Secondary | ICD-10-CM

## 2020-10-15 DIAGNOSIS — O0993 Supervision of high risk pregnancy, unspecified, third trimester: Secondary | ICD-10-CM

## 2020-10-15 LAB — POCT URINALYSIS DIPSTICK OB
Blood, UA: NEGATIVE
Glucose, UA: NEGATIVE
Nitrite, UA: POSITIVE
POC,PROTEIN,UA: NEGATIVE

## 2020-10-15 NOTE — Progress Notes (Signed)
[place] 35+4 wks,cephalic,fhr 146 bpm,posterior fundal placenta gr 3,AFI 12.7 cm,fhr 133 bpm,EFW 2562 g 33%

## 2020-10-15 NOTE — Progress Notes (Unsigned)
HIGH-RISK PREGNANCY VISIT Patient name: Kimberly Houston MRN [id number]  Date of birth: 19-May-1995 Chief Complaint:   High Risk Gestation ([place] today)  History of Present Illness:   Kimberly Houston is a 25 y.o. G36P1001 female at [redacted]w[redacted]d with an Estimated Date of Delivery: 11/15/20 being seen today for ongoing management of a high-risk pregnancy complicated by {High Risk OB:23190}.    Today she reports {pregnancy symptoms:25616::"no complaints"}. Contractions: Irregular. Vag. Bleeding: None.  Movement: Present. {Actions; denies-reports:120008} leaking of fluid.   Depression screen [facility] 2/9 09/13/2020 05/06/2020 04/17/2020  Decreased Interest 0 0 0  Down, Depressed, Hopeless 0 0 0  PHQ - 2 Score 0 0 0  Altered sleeping 0 3 2  Tired, decreased energy 0 1 2  Change in appetite 0 1 0  Feeling bad or failure about yourself  0 0 0  Trouble concentrating 0 3 0  Moving slowly or fidgety/restless 0 0 0  Suicidal thoughts 0 0 0  PHQ-9 Score 0 8 4     GAD 7 : Generalized Anxiety Score 09/13/2020 05/06/2020 04/17/2020  Nervous, Anxious, on Edge 0 0 0  Control/stop worrying 0 0 0  Worry too much - different things 0 0 0  Trouble relaxing 0 3 0  Restless 0 1 0  Easily annoyed or irritable 0 0 0  Afraid - awful might happen 0 0 0  Total GAD 7 Score 0 4 0     Review of Systems:   Pertinent items are noted in HPI Denies abnormal vaginal discharge w/ itching/odor/irritation, headaches, visual changes, shortness of breath, chest pain, abdominal pain, severe nausea/vomiting, or problems with urination or bowel movements unless otherwise stated above. Pertinent History Reviewed:  Reviewed past medical,surgical, social, obstetrical and family history.  Reviewed problem list, medications and allergies. Physical Assessment:   Vitals:   10/15/20 1646  BP: 109/76  Pulse: 69  Weight: 257 lb (116.6 kg)  Body mass index is 47.01 kg/m.           Physical Examination:   General appearance:  {appearance:315021::"alert, well appearing, and in no distress"}  Mental status: {pe mental status_general use:313008::"alert, oriented to person, place, and time"}  Skin: warm & dry   Extremities: Edema: Trace    Cardiovascular: normal heart rate noted  Respiratory: normal respiratory effort, no distress  Abdomen: gravid, soft, non-tender  Pelvic: {Blank single:19197::"Cervical exam performed","Cervical exam deferred"}         Fetal Status:     Movement: Present    Fetal Surveillance Testing today: ***   Chaperone: {Chaperone:19197::"N/A","pt declined","Latisha Cresenzo","Janet Young","Amanda Andrews","Peggy Dones","Angel Neas"}    Results for orders placed or performed in visit on 10/15/20 (from the past 24 hour(s))  POC Urinalysis Dipstick OB   Collection Time: 10/15/20  4:48 PM  Result Value Ref Range   Color, UA     Clarity, UA     Glucose, UA Negative Negative   Bilirubin, UA     Ketones, UA trace    Spec Grav, UA     Blood, UA neg    pH, UA     POC,PROTEIN,UA Negative Negative, Trace, Small (1+), Moderate (2+), Large (3+), 4+   Urobilinogen, UA     Nitrite, UA positive    Leukocytes, UA Moderate (2+) (A) Negative   Appearance     Odor      Assessment & Plan:  High-risk pregnancy: G2P1001 at [redacted]w[redacted]d with an Estimated Date of Delivery: 11/15/20   1) ***, {stable/unstable:60080}  2) ***, {  stable/unstable:60080}  Meds: No orders of the defined types were placed in this encounter.   Labs/procedures today: {ob lab/procedures:25214}  Treatment Plan:  ***  Reviewed: {Blank single:19197::"Term","Preterm"} labor symptoms and general obstetric precautions including but not limited to vaginal bleeding, contractions, leaking of fluid and fetal movement were reviewed in detail with the patient.  All questions were answered. {does does not:25387::"Does"} have home bp cuff. Office bp cuff given: {yes/no/default n/a:21102::"not applicable"}. Check bp {weekly daily:25388::"weekly"},  let us know if consistently {pregnant bp:25389::">140 and/or >90"}.  Follow-up: Return in about 1 week (around 10/22/2020) for HROB.   No future appointments.  Orders Placed This Encounter  Procedures  . POC Urinalysis Dipstick OB   Lazaro Arms CNM, [facility] 10/15/2020 5:15 PM

## 2020-10-22 ENCOUNTER — Other Ambulatory Visit: Payer: Self-pay

## 2020-10-22 ENCOUNTER — Other Ambulatory Visit (HOSPITAL_COMMUNITY)
Admission: RE | Admit: 2020-10-22 | Discharge: 2020-10-22 | Disposition: A | Discharge status: Home / Self Care | Payer: Medicaid Other | Source: Ambulatory Visit | Attending: Obstetrics & Gynecology | Admitting: Obstetrics & Gynecology

## 2020-10-22 ENCOUNTER — Ambulatory Visit (INDEPENDENT_AMBULATORY_CARE_PROVIDER_SITE_OTHER): Payer: Medicaid Other | Admitting: Obstetrics & Gynecology

## 2020-10-22 VITALS — BP 99/67 | HR 85 | Wt 259.4 lb

## 2020-10-22 DIAGNOSIS — Z3A36 36 weeks gestation of pregnancy: Secondary | ICD-10-CM

## 2020-10-22 DIAGNOSIS — O0993 Supervision of high risk pregnancy, unspecified, third trimester: Secondary | ICD-10-CM

## 2020-10-22 DIAGNOSIS — O24415 Gestational diabetes mellitus in pregnancy, controlled by oral hypoglycemic drugs: Secondary | ICD-10-CM

## 2020-10-22 MED ORDER — METFORMIN HCL 500 MG PO TABS: 500.0000 mg | ORAL_TABLET | Freq: Every day | ORAL | 6 refills | Status: DC

## 2020-10-22 NOTE — Progress Notes (Signed)
HIGH-RISK PREGNANCY VISIT Patient name: Kimberly Houston MRN [id number]  Date of birth: [birth year] Chief Complaint:   Routine Prenatal Visit  History of Present Illness:   Kimberly Houston is a 25 y.o. G69P1001 female at 41w4dwith an Estimated Date of Delivery: 11/15/20 being seen today for ongoing management of a high-risk pregnancy complicated by   -h/o gestHTN -GDMA1 Pt did not bring log; however sounds as though elevated PP throughout the day  -Anxiety/Dep- on zoloft daily -Asthma- stable -prior C-section  Today she reports no complaints.   Contractions: Irritability. Vag. Bleeding: None.  Movement: Present. denies leaking of fluid.   [facility] 09/13/2020 05/06/2020 04/17/2020  Decreased Interest 0 0 0  Down, Depressed, Hopeless 0 0 0  PHQ - 2 Score 0 0 0  Altered sleeping 0 3 2  Tired, decreased energy 0 1 2  Change in appetite 0 1 0  Feeling bad or failure about yourself  0 0 0  Trouble concentrating 0 3 0  Moving slowly or fidgety/restless 0 0 0  Suicidal thoughts 0 0 0  PHQ-9 Score 0 8 4     Current Outpatient Medications  Medication Instructions  . Accu-Chek Softclix Lancets lancets Use as instructed to check blood sugar 4 times daily  . acetaminophen (TYLENOL) 500 mg, Oral, Every 6 hours PRN  . albuterol (VENTOLIN HFA) 108 (90 Base) MCG/ACT inhaler 2 puffs, Inhalation, Every 6 hours PRN  . aspirin 162 mg, Oral, Daily, Swallow whole.  . Blood Glucose Monitoring Suppl (ACCU-CHEK GUIDE ME) w/Device KIT 1 each, Does not apply, 4 times daily  . Blood Pressure Monitor MISC For regular home bp monitoring during pregnancy  . cyclobenzaprine (FLEXERIL) 10 mg, Oral, 2 times daily PRN  . glucose blood (ACCU-CHEK GUIDE) test strip Use as instructed to check blood sugar 4 times daily  . hydrOXYzine (ATARAX/VISTARIL) 25 mg, Oral, Daily PRN  . metFORMIN (GLUCOPHAGE) 500 mg, Oral, Daily with breakfast  . multivitamin (VIT W/EXTRA C) CHEW chewable tablet 12  tablets, Oral, Daily  . Nutritional Supplements (PEDIASURE GROW & GAIN) LIQD 1 Bottle, Oral, 2 times daily  . promethazine (PHENERGAN) 25 mg, Oral, Every 6 hours PRN  . sertraline (ZOLOFT) 50 mg, Oral, Daily     Review of Systems:   Pertinent items are noted in HPI Denies abnormal vaginal discharge w/ itching/odor/irritation, headaches, visual changes, shortness of breath, chest pain, abdominal pain, severe nausea/vomiting, or problems with urination or bowel movements unless otherwise stated above. Pertinent History Reviewed:  Reviewed past medical,surgical, social, obstetrical and family history.  Reviewed problem list, medications and allergies. Physical Assessment:   Vitals:   10/22/20 1424  BP: 99/67  Pulse: 85  Weight: 259 lb 6.4 oz (117.7 kg)  Body mass index is 47.44 kg/m.           Physical Examination:   General appearance: alert, well appearing, and in no distress  Mental status: alert, oriented to person, place, and time  Skin: warm & dry   Extremities: Edema: Trace    Cardiovascular: normal heart rate noted  Respiratory: normal respiratory effort, no distress  Abdomen: gravid, soft, non-tender  Pelvic: Cervical exam deferred  Dilation: Closed Effacement (%): Thick Station: -3  Fetal Status: Fetal Heart Rate (bpm): 140 Fundal Height: 37 cm Movement: Present    Fetal Surveillance Testing today: doppler   Chaperone: BLendon Collar LPN    No results found for this or any previous visit (from the past 24 hour(s)).  Assessment & Plan:  High-risk pregnancy: G2P1001 at 50w4dwith an Estimated Date of Delivery: 11/15/20   1) GDMA1- pt did not bring log; however reports significantly elevated sugars -encouraged pt to start metformin 5080mdaily- pt admits to likely noncompliance due to cost of medication -encouraged pt to try and come for NST at least weekly -last growth scan- 09/23/60/95cephalic,fhr 14284pm,posterior fundal placenta gr 3,AFI 12.7 cm,fhr 133 bpm,EFW 2562  g 33%   2) Social concerns -recently broke up with fiancee and has limited resources -encouraged pt to reach out to social services  3) h/o C-section- desires TOLAC    Meds:  Meds ordered this encounter  Medications  . metFORMIN (GLUCOPHAGE) 500 MG tablet    Sig: Take 1 tablet (500 mg total) by mouth daily with breakfast.    Dispense:  30 tablet    Refill:  6    Labs/procedures today: none  Treatment Plan:  Encouraged antepartum testing and pt to start medication, f/u in 1wk  Reviewed: Term labor symptoms and general obstetric precautions including but not limited to vaginal bleeding, contractions, leaking of fluid and fetal movement were reviewed in detail with the patient.  All questions were answered. Pt has home bp cuff. Check bp weekly, let usKoreanow if >140/90.   Follow-up: Return in about 1 week (around 10/29/2020) for HROB visit and NST weekly.   Future Appointments  Date Time Provider [place]6/14/2022  1:50 PM EuFlorian BuffMD [facility]    Orders Placed This Encounter  Procedures  . Strep Gp B NAA+Rflx    JeJanyth PupaDO Attending [facility] WoDean Foods CompanyCoHaiku-Pauwela

## 2020-10-24 LAB — CERVICOVAGINAL ANCILLARY ONLY
Chlamydia: NEGATIVE
Comment: NEGATIVE
Comment: NORMAL
Neisseria Gonorrhea: NEGATIVE

## 2020-10-26 LAB — STREP GP B SUSCEPTIBILITY

## 2020-10-26 LAB — STREP GP B NAA+RFLX: Strep Gp B NAA+Rflx: POSITIVE — AB

## 2020-10-27 ENCOUNTER — Inpatient Hospital Stay (HOSPITAL_COMMUNITY)
Admission: AD | Admit: 2020-10-27 | Discharge: 2020-10-27 | Disposition: A | Discharge status: Home / Self Care | Payer: Medicaid Other | Attending: Obstetrics and Gynecology | Admitting: Obstetrics and Gynecology

## 2020-10-27 ENCOUNTER — Encounter (HOSPITAL_COMMUNITY): Payer: Self-pay | Admitting: Obstetrics and Gynecology

## 2020-10-27 DIAGNOSIS — Z880 Allergy status to penicillin: Secondary | ICD-10-CM | POA: Insufficient documentation

## 2020-10-27 DIAGNOSIS — O368120 Decreased fetal movements, second trimester, not applicable or unspecified: Secondary | ICD-10-CM

## 2020-10-27 DIAGNOSIS — Z87891 Personal history of nicotine dependence: Secondary | ICD-10-CM | POA: Diagnosis not present

## 2020-10-27 DIAGNOSIS — Z7982 Long term (current) use of aspirin: Secondary | ICD-10-CM | POA: Diagnosis not present

## 2020-10-27 DIAGNOSIS — Z79899 Other long term (current) drug therapy: Secondary | ICD-10-CM | POA: Diagnosis not present

## 2020-10-27 DIAGNOSIS — O479 False labor, unspecified: Secondary | ICD-10-CM

## 2020-10-27 DIAGNOSIS — Z3A37 37 weeks gestation of pregnancy: Secondary | ICD-10-CM | POA: Insufficient documentation

## 2020-10-27 DIAGNOSIS — O471 False labor at or after 37 completed weeks of gestation: Secondary | ICD-10-CM | POA: Insufficient documentation

## 2020-10-27 DIAGNOSIS — O368130 Decreased fetal movements, third trimester, not applicable or unspecified: Secondary | ICD-10-CM | POA: Diagnosis not present

## 2020-10-27 DIAGNOSIS — Z881 Allergy status to other antibiotic agents status: Secondary | ICD-10-CM | POA: Insufficient documentation

## 2020-10-27 DIAGNOSIS — Z3689 Encounter for other specified antenatal screening: Secondary | ICD-10-CM

## 2020-10-27 DIAGNOSIS — O0993 Supervision of high risk pregnancy, unspecified, third trimester: Secondary | ICD-10-CM

## 2020-10-27 DIAGNOSIS — Z7984 Long term (current) use of oral hypoglycemic drugs: Secondary | ICD-10-CM | POA: Insufficient documentation

## 2020-10-27 LAB — URINALYSIS, ROUTINE W REFLEX MICROSCOPIC
Bilirubin Urine: NEGATIVE
Glucose, UA: NEGATIVE mg/dL
Hgb urine dipstick: NEGATIVE
Ketones, ur: 20 mg/dL — AB
Nitrite: NEGATIVE
Protein, ur: 30 mg/dL — AB
Specific Gravity, Urine: 1.028 (ref 1.005–1.030)
pH: 5 (ref 5.0–8.0)

## 2020-10-27 NOTE — MAU Note (Signed)
EMS arriaval. Pt c/o ctx on and off since 7:30 pm.  About 2 min apart now.Stated she had not felt baby move much all day. Denies any vag bleeding or leaking a this time. Was closed internally on her last exam.   Stated she has not eaten drank much all day. Has been cleaning all day and under "stress"

## 2020-10-27 NOTE — MAU Provider Note (Addendum)
History     CSN: [id number]  Arrival date and time: 10/27/20 2131   Event Date/Time   First Provider Initiated Contact with Patient 10/27/20 2224      Chief Complaint  Patient presents with   Labor Eval   Kimberly Houston is a 25 y.o. G2P1001 at 5w2dwho [facility]  She presents today, via EMS, for Labor Eval.  She also reports decreased fetal movement. She states she hasn't felt movement all day, but has been experiencing movement since arrival.  She denies vaginal concerns including leaking, bleeding, and discharge.     OB History     Gravida  2   Para  1   Term  1   Preterm      AB      Living  1      SAB      IAB      Ectopic      Multiple      Live Births  1           Past Medical History:  Diagnosis Date   Anxiety    Asthma    Depression    Gestational diabetes    Pregnancy induced hypertension     Past Surgical History:  Procedure Laterality Date   c section     CESAREAN SECTION     TYMPANOSTOMY TUBE PLACEMENT  2018    Family History  Problem Relation Age of Onset   Heart attack Paternal Grandfather    COPD Paternal Grandmother    Asthma Paternal Grandmother    Hypertension Maternal Grandfather    Diabetes Maternal Grandfather    Seizures Father    Alcohol abuse Father    Other Mother        deg. disc disease    Social History   Tobacco Use   Smoking status: Former    Packs/day: 0.50    Years: 6.00    Pack years: 3.00    Types: Cigarettes   Smokeless tobacco: Never   Tobacco comments:    smokes 1 cig daily  Vaping Use   Vaping Use: Some days  Substance Use Topics   Alcohol use: Not Currently    Comment: "occasionally every now and then I will drink a wine cooler" - as of 08/01/2020   Drug use: Not Currently    Allergies:  Allergies  Allergen Reactions   Amoxicillin Hives   Drug Ingredient [Azithromycin] Hives   Erythromycin Hives   Gentamicin Hives   Metronidazole Hives   Penicillins  Hives   Latex Rash    Medications Prior to Admission  Medication Sig Dispense Refill Last Dose   Accu-Chek Softclix Lancets lancets Use as instructed to check blood sugar 4 times daily 100 each 12    acetaminophen (TYLENOL) 500 MG tablet Take 500 mg by mouth every 6 (six) hours as needed.      albuterol (VENTOLIN HFA) 108 (90 Base) MCG/ACT inhaler Inhale 2 puffs into the lungs every 6 (six) hours as needed for wheezing or shortness of breath.      aspirin 81 MG EC tablet Take 2 tablets (162 mg total) by mouth daily. Swallow whole. 180 tablet 2    Blood Glucose Monitoring Suppl (ACCU-CHEK GUIDE ME) w/Device KIT 1 each by Does not apply route 4 (four) times daily. 1 kit 0    Blood Pressure Monitor MISC For regular home bp monitoring during pregnancy 1 each 0    cyclobenzaprine (FLEXERIL) 10  MG tablet Take 1 tablet (10 mg total) by mouth 2 (two) times daily as needed for muscle spasms. 20 tablet 0    glucose blood (ACCU-CHEK GUIDE) test strip Use as instructed to check blood sugar 4 times daily 50 each 12    hydrOXYzine (ATARAX/VISTARIL) 25 MG tablet Take 25 mg by mouth daily as needed for itching (allergies).      metFORMIN (GLUCOPHAGE) 500 MG tablet Take 1 tablet (500 mg total) by mouth daily with breakfast. 30 tablet 6    multivitamin (VIT W/EXTRA C) CHEW chewable tablet Chew 12 tablets by mouth daily.      Nutritional Supplements (PEDIASURE GROW & GAIN) LIQD Take 1 Bottle by mouth in the morning and at bedtime. (Patient taking differently: Take 1 Bottle by mouth as needed.) 237 mL 2    promethazine (PHENERGAN) 25 MG tablet Take 1 tablet (25 mg total) by mouth every 6 (six) hours as needed for nausea or vomiting. 30 tablet 1    sertraline (ZOLOFT) 50 MG tablet Take 1 tablet (50 mg total) by mouth daily. 90 tablet 3     Review of Systems  Gastrointestinal:  Positive for abdominal pain (Contractions).  Genitourinary:  Negative for difficulty urinating, dysuria, vaginal bleeding and vaginal  discharge.  Physical Exam   Blood pressure (!) 133/55, pulse 66, temperature 98.8 F (37.1 C), resp. rate 19, last menstrual period 02/09/2020, SpO2 100 %.  Physical Exam Constitutional:      Appearance: Normal appearance. She is obese.  HENT:     Head: Normocephalic and atraumatic.  Eyes:     Conjunctiva/sclera: Conjunctivae normal.  Cardiovascular:     Rate and Rhythm: Normal rate.  Abdominal:     Palpations: Abdomen is soft.     Tenderness: There is no abdominal tenderness.  Musculoskeletal:     Cervical back: Normal range of motion.  Skin:    General: Skin is warm and dry.  Neurological:     Mental Status: She is alert and oriented to person, place, and time.  Psychiatric:        Mood and Affect: Mood normal.        Behavior: Behavior normal.        Thought Content: Thought content normal.   Dilation: Closed Exam by:: Elray Mcgregor, RN  Fetal Assessment 135 bpm, Mod Var, -Decels, +Accels Toco: No ctx or irritability graphed  MAU Course   Results for orders placed or performed during the hospital encounter of 10/27/20 (from the past 24 hour(s))  Urinalysis, Routine w reflex microscopic Urine, Clean Catch     Status: Abnormal   Collection Time: 10/27/20  9:46 PM  Result Value Ref Range   Color, Urine AMBER (A) YELLOW   APPearance CLOUDY (A) CLEAR   Specific Gravity, Urine 1.028 1.005 - 1.030   pH 5.0 5.0 - 8.0   Glucose, UA NEGATIVE NEGATIVE mg/dL   Hgb urine dipstick NEGATIVE NEGATIVE   Bilirubin Urine NEGATIVE NEGATIVE   Ketones, ur 20 (A) NEGATIVE mg/dL   Protein, ur 30 (A) NEGATIVE mg/dL   Nitrite NEGATIVE NEGATIVE   Leukocytes,Ua SMALL (A) NEGATIVE   RBC / HPF 0-5 0 - 5 RBC/hpf   WBC, UA 11-20 0 - 5 WBC/hpf   Bacteria, UA FEW (A) NONE SEEN   Squamous Epithelial / LPF 11-20 0 - 5   Mucus PRESENT    Non Squamous Epithelial 0-5 (A) NONE SEEN   No results found.  MDM PE Labs: UA, UC EFM Assessment and  Plan  25 year old [place] at 37.2  weeks Cat I FT False Labor Decreased Fetal Movement   -Informed that cervix is closed per nurse report. -Will monitor infant for at least 30 minutes and if strip remains reactive will discharge to home. -Patient without questions. -Nurse to adjust EFM for improved reading.    Maryann Conners MSN, CNM 10/27/2020, 10:25 PM    Reassessment (11:18 PM) NST reactive  -Discharged to home in stable condition.  Maryann Conners MSN, CNM Advanced Practice Provider, [facility] for [employer]

## 2020-10-29 ENCOUNTER — Encounter: Payer: Self-pay | Admitting: Obstetrics & Gynecology

## 2020-10-29 ENCOUNTER — Other Ambulatory Visit: Payer: Self-pay

## 2020-10-29 ENCOUNTER — Ambulatory Visit (INDEPENDENT_AMBULATORY_CARE_PROVIDER_SITE_OTHER): Payer: Medicaid Other | Admitting: Obstetrics & Gynecology

## 2020-10-29 VITALS — BP 105/66 | HR 58 | Wt 259.0 lb

## 2020-10-29 DIAGNOSIS — O24410 Gestational diabetes mellitus in pregnancy, diet controlled: Secondary | ICD-10-CM | POA: Diagnosis not present

## 2020-10-29 DIAGNOSIS — O0993 Supervision of high risk pregnancy, unspecified, third trimester: Secondary | ICD-10-CM

## 2020-10-29 LAB — CULTURE, OB URINE

## 2020-10-29 NOTE — Progress Notes (Signed)
HIGH-RISK PREGNANCY VISIT Patient name: Kimberly Houston MRN [id number]  Date of birth: 04/07/1996 Chief Complaint:   Routine Prenatal Visit  History of Present Illness:   Kimberly Houston is a 25 y.o. G28P1001 female at [redacted]w[redacted]d with an Estimated Date of Delivery: 11/15/20 being seen today for ongoing management of a high-risk pregnancy complicated by diabetes mellitus A1DM teetering on A2 but improved diet so has not needed to start the metformin  Today she reports no complaints. Contractions: Irritability. Vag. Bleeding: None.  Movement: Present. denies leaking of fluid.   Depression screen [facility] 2/9 09/13/2020 05/06/2020 04/17/2020  Decreased Interest 0 0 0  Down, Depressed, Hopeless 0 0 0  PHQ - 2 Score 0 0 0  Altered sleeping 0 3 2  Tired, decreased energy 0 1 2  Change in appetite 0 1 0  Feeling bad or failure about yourself  0 0 0  Trouble concentrating 0 3 0  Moving slowly or fidgety/restless 0 0 0  Suicidal thoughts 0 0 0  PHQ-9 Score 0 8 4     GAD 7 : Generalized Anxiety Score 09/13/2020 05/06/2020 04/17/2020  Nervous, Anxious, on Edge 0 0 0  Control/stop worrying 0 0 0  Worry too much - different things 0 0 0  Trouble relaxing 0 3 0  Restless 0 1 0  Easily annoyed or irritable 0 0 0  Afraid - awful might happen 0 0 0  Total GAD 7 Score 0 4 0     Review of Systems:   Pertinent items are noted in HPI Denies abnormal vaginal discharge w/ itching/odor/irritation, headaches, visual changes, shortness of breath, chest pain, abdominal pain, severe nausea/vomiting, or problems with urination or bowel movements unless otherwise stated above. Pertinent History Reviewed:  Reviewed past medical,surgical, social, obstetrical and family history.  Reviewed problem list, medications and allergies. Physical Assessment:   Vitals:   10/29/20 1337  BP: 105/66  Pulse: (!) 58  Weight: 259 lb (117.5 kg)  Body mass index is 47.37 kg/m.           Physical Examination:   General  appearance: alert, well appearing, and in no distress  Mental status: alert, oriented to person, place, and time  Skin: warm & dry   Extremities: Edema: Trace    Cardiovascular: normal heart rate noted  Respiratory: normal respiratory effort, no distress  Abdomen: gravid, soft, non-tender  Pelvic: Cervical exam deferred         Fetal Status:   Fundal Height: 39 cm Movement: Present    Fetal Surveillance Testing today: reactive NST  Katlen Seyer is at [redacted]w[redacted]d Estimated Date of Delivery: 11/15/20  NST being performed due to A1/2 diabetes  Today the NST is Reactive  Fetal Monitoring:  Baseline: 140s bpm, Variability: Good {> 6 bpm), Accelerations: Reactive, and Decelerations: Absent   reactive  The accelerations are >15 bpm and more than 2 in 20 minutes  Final diagnosis:  Reactive NST  Lazaro Arms, MD     Chaperone: Faith Rogue    No results found for this or any previous visit (from the past 24 hour(s)).  Assessment & Plan:  High-risk pregnancy: G2P1001 at [redacted]w[redacted]d with an Estimated Date of Delivery: 11/15/20   1) A1/2 DM tettering on A2 but imporved diet, not on metformin at present,   2) Previous C section, desires TOLAC  Meds: No orders of the defined types were placed in this encounter.   Labs/procedures today: NST  Treatment Plan:  weekly testing IOL  39 weeks  Reviewed: Term labor symptoms and general obstetric precautions including but not limited to vaginal bleeding, contractions, leaking of fluid and fetal movement were reviewed in detail with the patient.  All questions were answered. Does have home bp cuff. Office bp cuff given: not applicable. Check bp daily, let us know if consistently >140 and/or >90.  Follow-up: No follow-ups on file.   Future Appointments  Date Time Provider Department Center  11/05/2020  3:50 PM Myna Hidalgo, DO [facility]  11/12/2020  3:10 PM Gokul Waybright, Amaryllis Dyke, MD [facility]    No orders of the defined types were placed in  this encounter.  Lazaro Arms  10/29/2020 2:43 PM

## 2020-10-31 ENCOUNTER — Other Ambulatory Visit (INDEPENDENT_AMBULATORY_CARE_PROVIDER_SITE_OTHER): Payer: Self-pay

## 2020-11-01 ENCOUNTER — Other Ambulatory Visit: Payer: Self-pay

## 2020-11-01 MED ORDER — CYCLOBENZAPRINE HCL 10 MG PO TABS: 10.0000 mg | ORAL_TABLET | Freq: Two times a day (BID) | ORAL | 0 refills | Status: DC | PRN

## 2020-11-01 NOTE — Telephone Encounter (Signed)
OK to refill

## 2020-11-04 ENCOUNTER — Inpatient Hospital Stay (HOSPITAL_COMMUNITY)
Admission: AD | Admit: 2020-11-04 | Discharge: 2020-11-04 | Disposition: A | Discharge status: Home / Self Care | Payer: Medicaid Other | Attending: Family Medicine | Admitting: Family Medicine

## 2020-11-04 ENCOUNTER — Encounter (HOSPITAL_COMMUNITY): Payer: Self-pay | Admitting: Family Medicine

## 2020-11-04 ENCOUNTER — Inpatient Hospital Stay (HOSPITAL_BASED_OUTPATIENT_CLINIC_OR_DEPARTMENT_OTHER): Payer: Medicaid Other

## 2020-11-04 DIAGNOSIS — O368330 Maternal care for abnormalities of the fetal heart rate or rhythm, third trimester, not applicable or unspecified: Secondary | ICD-10-CM

## 2020-11-04 DIAGNOSIS — Z3A38 38 weeks gestation of pregnancy: Secondary | ICD-10-CM | POA: Diagnosis not present

## 2020-11-04 DIAGNOSIS — O09293 Supervision of pregnancy with other poor reproductive or obstetric history, third trimester: Secondary | ICD-10-CM | POA: Diagnosis not present

## 2020-11-04 DIAGNOSIS — Z8632 Personal history of gestational diabetes: Secondary | ICD-10-CM | POA: Diagnosis not present

## 2020-11-04 DIAGNOSIS — O471 False labor at or after 37 completed weeks of gestation: Secondary | ICD-10-CM

## 2020-11-04 DIAGNOSIS — O34219 Maternal care for unspecified type scar from previous cesarean delivery: Secondary | ICD-10-CM | POA: Diagnosis not present

## 2020-11-04 DIAGNOSIS — O99413 Diseases of the circulatory system complicating pregnancy, third trimester: Secondary | ICD-10-CM | POA: Diagnosis not present

## 2020-11-04 DIAGNOSIS — I499 Cardiac arrhythmia, unspecified: Secondary | ICD-10-CM | POA: Insufficient documentation

## 2020-11-04 DIAGNOSIS — Z8759 Personal history of other complications of pregnancy, childbirth and the puerperium: Secondary | ICD-10-CM

## 2020-11-04 DIAGNOSIS — O0993 Supervision of high risk pregnancy, unspecified, third trimester: Secondary | ICD-10-CM

## 2020-11-04 DIAGNOSIS — Z3689 Encounter for other specified antenatal screening: Secondary | ICD-10-CM

## 2020-11-04 DIAGNOSIS — O24410 Gestational diabetes mellitus in pregnancy, diet controlled: Secondary | ICD-10-CM

## 2020-11-04 MED ORDER — ALBUTEROL SULFATE HFA 108 (90 BASE) MCG/ACT IN AERS: 2.0000 | INHALATION_SPRAY | Freq: Four times a day (QID) | RESPIRATORY_TRACT | 3 refills | Status: AC | PRN

## 2020-11-04 MED ORDER — HYDROXYZINE HCL 25 MG PO TABS: 25.0000 mg | ORAL_TABLET | Freq: Every day | ORAL | 0 refills | Status: AC | PRN

## 2020-11-04 NOTE — MAU Note (Signed)
Pt reports she has been having ctx since yesterday. Now they are 5-8 min aprt. Good fetal movement felt. Report some bloody show and white mucusy discharge.

## 2020-11-04 NOTE — MAU Provider Note (Signed)
S: Ms. Kimberly Houston is a 25 y.o. G2P1001 at [redacted]w[redacted]d  who presents to MAU today complaining contractions q 5-8 minutes since yesterday. She denies vaginal bleeding other than very scant bloody show. She denies LOF. She reports normal fetal movement.    O: BP (!) 100/54   Pulse 68   Temp 98.3 F (36.8 C)   Resp 18   Ht 5\' 2"  (1.575 m)   Wt 260 lb (117.9 kg)   LMP 02/09/2020   BMI 47.55 kg/m  GENERAL: Well-developed, well-nourished female in no acute distress.  HEAD: Normocephalic, atraumatic.  CHEST: Normal effort of breathing, regular heart rate ABDOMEN: Soft, nontender, gravid  Cervical exam (x2):  Dilation: Closed Effacement (%): Thick Exam by:: [email or web address], RN   Fetal Monitoring: reactive with fetal arrhythmia Baseline: 150 Variability: moderate Accelerations: 15x15 Decelerations: none (initially appeared to by somewhat rhythmic decels to 40-50) Contractions: UI to q10-66min  RN notified CNM of heart rate issue, appeared to be fetal arrhythmia. Pt sent to U/S which found PVCs per U/S tech. Consulted Dr. 12m who read images and confirmed fetal PVCs with appropriate compensatory pause. Pt ok to be discharged as she is not in labor, will follow up in the office as scheduled.  A: SIUP at [redacted]w[redacted]d  False labor  P: Discharge home in stable condition with term labor precautions Follow [facility] at [facility] as scheduled for ongoing prenatal care  [facility], CNM 11/04/2020 9:41 PM

## 2020-11-05 ENCOUNTER — Other Ambulatory Visit: Payer: Medicaid Other | Admitting: Obstetrics & Gynecology

## 2020-11-07 ENCOUNTER — Encounter: Payer: Self-pay | Admitting: Obstetrics & Gynecology

## 2020-11-07 ENCOUNTER — Ambulatory Visit (INDEPENDENT_AMBULATORY_CARE_PROVIDER_SITE_OTHER): Payer: Medicaid Other | Admitting: Obstetrics & Gynecology

## 2020-11-07 ENCOUNTER — Other Ambulatory Visit: Payer: Self-pay

## 2020-11-07 ENCOUNTER — Other Ambulatory Visit (HOSPITAL_COMMUNITY): Payer: Self-pay | Admitting: Advanced Practice Midwife

## 2020-11-07 VITALS — BP 101/67 | HR 67 | Wt 258.2 lb

## 2020-11-07 DIAGNOSIS — O0993 Supervision of high risk pregnancy, unspecified, third trimester: Secondary | ICD-10-CM

## 2020-11-07 DIAGNOSIS — O24410 Gestational diabetes mellitus in pregnancy, diet controlled: Secondary | ICD-10-CM

## 2020-11-07 DIAGNOSIS — Z3A38 38 weeks gestation of pregnancy: Secondary | ICD-10-CM | POA: Diagnosis not present

## 2020-11-07 NOTE — Progress Notes (Signed)
HIGH-RISK PREGNANCY VISIT Patient name: Kimberly Houston MRN [id number]  Date of birth: Oct 30, 1995 Chief Complaint:   Routine Prenatal Visit (nst)  History of Present Illness:   Kimberly Houston is a 25 y.o. G41P1001 female at 14w6dwith an Estimated Date of Delivery: 11/15/20 being seen today for ongoing management of a high-risk pregnancy complicated by diabetes mellitus A1DM- almost A2.    Pt does not have sugar log- but states they have been within the normal range.   Today she reports occasional contractions.  Contractions: Irritability. Vag. Bleeding: None.  Movement: Present. denies leaking of fluid.   [facility] 09/13/2020 05/06/2020 04/17/2020  Decreased Interest 0 0 0  Down, Depressed, Hopeless 0 0 0  PHQ - 2 Score 0 0 0  Altered sleeping 0 3 2  Tired, decreased energy 0 1 2  Change in appetite 0 1 0  Feeling bad or failure about yourself  0 0 0  Trouble concentrating 0 3 0  Moving slowly or fidgety/restless 0 0 0  Suicidal thoughts 0 0 0  PHQ-9 Score 0 8 4     Current Outpatient Medications  Medication Instructions   Accu-Chek Softclix Lancets lancets Use as instructed to check blood sugar 4 times daily   acetaminophen (TYLENOL) 500 mg, Oral, Every 6 hours PRN   albuterol (VENTOLIN HFA) 108 (90 Base) MCG/ACT inhaler 2 puffs, Inhalation, Every 6 hours PRN   aspirin 162 mg, Oral, Daily, Swallow whole.   Blood Glucose Monitoring Suppl (ACCU-CHEK GUIDE ME) w/Device KIT 1 each, Does not apply, 4 times daily   Blood Pressure Monitor MISC For regular home bp monitoring during pregnancy   cyclobenzaprine (FLEXERIL) 10 mg, Oral, 2 times daily PRN   glucose blood (ACCU-CHEK GUIDE) test strip Use as instructed to check blood sugar 4 times daily   hydrOXYzine (ATARAX/VISTARIL) 25 mg, Oral, Daily PRN   metFORMIN (GLUCOPHAGE) 500 mg, Oral, Daily with breakfast   multivitamin (VIT W/EXTRA C) CHEW chewable tablet 12 tablets, Daily   promethazine (PHENERGAN) 25 mg,  Oral, Every 6 hours PRN   sertraline (ZOLOFT) 50 mg, Oral, Daily     Review of Systems:   Pertinent items are noted in HPI Denies abnormal vaginal discharge w/ itching/odor/irritation, headaches, visual changes, shortness of breath, chest pain, abdominal pain, severe nausea/vomiting, or problems with urination or bowel movements unless otherwise stated above. Pertinent History Reviewed:  Reviewed past medical,surgical, social, obstetrical and family history.  Reviewed problem list, medications and allergies. Physical Assessment:   Vitals:   11/07/20 1059  BP: 101/67  Pulse: 67  Weight: 258 lb 3.2 oz (117.1 kg)  Body mass index is 47.23 kg/m.           Physical Examination:   General appearance: alert, well appearing, and in no distress  Mental status: alert, oriented to person, place, and time  Skin: warm & dry   Extremities: Edema: Trace    Cardiovascular: normal heart rate noted  Respiratory: normal respiratory effort, no distress  Abdomen: gravid, soft, non-tender  Pelvic: Cervical exam performed  Dilation: Closed Effacement (%): Thick Station: -3  Fetal Status: Fetal Heart Rate (bpm): 130 Fundal Height: 39 cm Movement: Present    Fetal Surveillance Testing today: NST-reactive   NST being performed due to GDMA1   Fetal Monitoring:  Baseline: 130 bpm, Variability: moderate, Accelerations: present, The accelerations are >15 bpm and more than 2 in 20 minutes, and Decelerations: Absent   reactive  Final diagnosis:  Reactive NST  Chaperone:  declined    Assessment & Plan:  High-risk pregnancy: G2P1001 at 52w6dwith an Estimated Date of Delivery: 11/15/20   1) GMDA1-  Growth @ [id number](33), vertex, posterior NST reactive today Scheduled for IOL on Sunday- 6/26  2) TOLAC  3) Fetal arrhythmia -see MFM note/US, advised postnatal ECHO if arrhythmia persists  Meds: No orders of the defined types were placed in this encounter.  Labs/procedures today:  none  Treatment Plan:  plan for IOL- Sunday  Reviewed: Term labor symptoms and general obstetric precautions including but not limited to vaginal bleeding, contractions, leaking of fluid and fetal movement were reviewed in detail with the patient.  All questions were answered. Pt has home bp cuff. Check bp weekly, let uKoreaknow if >140/90.   Follow-up: Return in about 1 week (around 11/14/2020) for scheduled for IOL.   Future Appointments  Date Time Provider [place] 11/10/2020 12:00 AM [facility] [place]  11/12/2020  8:50 AM Eure, LMertie Clause MD [facility]    No orders of the defined types were placed in this encounter.   JJanyth Pupa DO Attending ONew Home FHosp San Franciscofor WDean Foods Company CCalypso

## 2020-11-09 ENCOUNTER — Other Ambulatory Visit: Payer: Self-pay

## 2020-11-10 ENCOUNTER — Encounter (HOSPITAL_COMMUNITY)
Admission: AD | Disposition: A | Discharge status: Home / Self Care | Payer: Self-pay | Source: Home / Self Care | Attending: Obstetrics & Gynecology

## 2020-11-10 ENCOUNTER — Inpatient Hospital Stay (HOSPITAL_COMMUNITY)
Admission: AD | Admit: 2020-11-10 | Discharge: 2020-11-13 | DRG: 787 | Disposition: A | Discharge status: Home / Self Care | Payer: Medicaid Other | Attending: Obstetrics & Gynecology | Admitting: Obstetrics & Gynecology

## 2020-11-10 ENCOUNTER — Encounter (HOSPITAL_COMMUNITY): Payer: Self-pay | Admitting: Obstetrics & Gynecology

## 2020-11-10 ENCOUNTER — Inpatient Hospital Stay (HOSPITAL_COMMUNITY): Payer: Medicaid Other | Admitting: Anesthesiology

## 2020-11-10 ENCOUNTER — Inpatient Hospital Stay (HOSPITAL_COMMUNITY): Payer: Medicaid Other

## 2020-11-10 ENCOUNTER — Encounter (HOSPITAL_COMMUNITY): Payer: Self-pay | Admitting: Certified Registered Nurse Anesthetist

## 2020-11-10 DIAGNOSIS — F419 Anxiety disorder, unspecified: Secondary | ICD-10-CM | POA: Diagnosis present

## 2020-11-10 DIAGNOSIS — O9952 Diseases of the respiratory system complicating childbirth: Secondary | ICD-10-CM | POA: Diagnosis present

## 2020-11-10 DIAGNOSIS — F418 Other specified anxiety disorders: Secondary | ICD-10-CM | POA: Diagnosis present

## 2020-11-10 DIAGNOSIS — O99214 Obesity complicating childbirth: Secondary | ICD-10-CM | POA: Diagnosis present

## 2020-11-10 DIAGNOSIS — F32A Depression, unspecified: Secondary | ICD-10-CM | POA: Diagnosis present

## 2020-11-10 DIAGNOSIS — O24419 Gestational diabetes mellitus in pregnancy, unspecified control: Secondary | ICD-10-CM | POA: Diagnosis present

## 2020-11-10 DIAGNOSIS — O24420 Gestational diabetes mellitus in childbirth, diet controlled: Secondary | ICD-10-CM | POA: Diagnosis present

## 2020-11-10 DIAGNOSIS — O99824 Streptococcus B carrier state complicating childbirth: Secondary | ICD-10-CM | POA: Diagnosis present

## 2020-11-10 DIAGNOSIS — Z79899 Other long term (current) drug therapy: Secondary | ICD-10-CM

## 2020-11-10 DIAGNOSIS — F17210 Nicotine dependence, cigarettes, uncomplicated: Secondary | ICD-10-CM | POA: Diagnosis present

## 2020-11-10 DIAGNOSIS — Z30430 Encounter for insertion of intrauterine contraceptive device: Secondary | ICD-10-CM | POA: Diagnosis not present

## 2020-11-10 DIAGNOSIS — O9081 Anemia of the puerperium: Secondary | ICD-10-CM | POA: Diagnosis not present

## 2020-11-10 DIAGNOSIS — J45909 Unspecified asthma, uncomplicated: Secondary | ICD-10-CM | POA: Diagnosis present

## 2020-11-10 DIAGNOSIS — O26899 Other specified pregnancy related conditions, unspecified trimester: Secondary | ICD-10-CM

## 2020-11-10 DIAGNOSIS — Z98891 History of uterine scar from previous surgery: Secondary | ICD-10-CM

## 2020-11-10 DIAGNOSIS — O0990 Supervision of high risk pregnancy, unspecified, unspecified trimester: Secondary | ICD-10-CM

## 2020-11-10 DIAGNOSIS — O99820 Streptococcus B carrier state complicating pregnancy: Secondary | ICD-10-CM

## 2020-11-10 DIAGNOSIS — Z6791 Unspecified blood type, Rh negative: Secondary | ICD-10-CM

## 2020-11-10 DIAGNOSIS — Z3A39 39 weeks gestation of pregnancy: Secondary | ICD-10-CM

## 2020-11-10 DIAGNOSIS — O34211 Maternal care for low transverse scar from previous cesarean delivery: Secondary | ICD-10-CM | POA: Diagnosis present

## 2020-11-10 DIAGNOSIS — O134 Gestational [pregnancy-induced] hypertension without significant proteinuria, complicating childbirth: Secondary | ICD-10-CM | POA: Diagnosis present

## 2020-11-10 DIAGNOSIS — O26893 Other specified pregnancy related conditions, third trimester: Secondary | ICD-10-CM | POA: Diagnosis present

## 2020-11-10 DIAGNOSIS — Z20822 Contact with and (suspected) exposure to covid-19: Secondary | ICD-10-CM | POA: Diagnosis present

## 2020-11-10 DIAGNOSIS — Z975 Presence of (intrauterine) contraceptive device: Secondary | ICD-10-CM

## 2020-11-10 DIAGNOSIS — O99344 Other mental disorders complicating childbirth: Secondary | ICD-10-CM | POA: Diagnosis present

## 2020-11-10 DIAGNOSIS — Z7982 Long term (current) use of aspirin: Secondary | ICD-10-CM

## 2020-11-10 DIAGNOSIS — O99334 Smoking (tobacco) complicating childbirth: Secondary | ICD-10-CM | POA: Diagnosis present

## 2020-11-10 DIAGNOSIS — O24424 Gestational diabetes mellitus in childbirth, insulin controlled: Secondary | ICD-10-CM

## 2020-11-10 DIAGNOSIS — D62 Acute posthemorrhagic anemia: Secondary | ICD-10-CM | POA: Diagnosis not present

## 2020-11-10 DIAGNOSIS — F17200 Nicotine dependence, unspecified, uncomplicated: Secondary | ICD-10-CM | POA: Diagnosis present

## 2020-11-10 LAB — CBC
HCT: 35 % — ABNORMAL LOW (ref 36.0–46.0)
HCT: 37.4 % (ref 36.0–46.0)
Hemoglobin: 11.3 g/dL — ABNORMAL LOW (ref 12.0–15.0)
Hemoglobin: 12 g/dL (ref 12.0–15.0)
MCH: 28.8 pg (ref 26.0–34.0)
MCH: 28.5 pg (ref 26.0–34.0)
MCHC: 32.3 g/dL (ref 30.0–36.0)
MCHC: 32.1 g/dL (ref 30.0–36.0)
MCV: 89.3 fL (ref 80.0–100.0)
MCV: 88.8 fL (ref 80.0–100.0)
Platelets: 332 10*3/uL (ref 150–400)
Platelets: 329 10*3/uL (ref 150–400)
RBC: 3.92 MIL/uL (ref 3.87–5.11)
RBC: 4.21 MIL/uL (ref 3.87–5.11)
RDW: 14.6 % (ref 11.5–15.5)
RDW: 14.6 % (ref 11.5–15.5)
WBC: 8.9 10*3/uL (ref 4.0–10.5)
WBC: 12.1 10*3/uL — ABNORMAL HIGH (ref 4.0–10.5)
nRBC: 0 % (ref 0.0–0.2)
nRBC: 0 % (ref 0.0–0.2)

## 2020-11-10 LAB — COMPREHENSIVE METABOLIC PANEL
ALT: 88 U/L — ABNORMAL HIGH (ref 0–44)
ALT: 86 U/L — ABNORMAL HIGH (ref 0–44)
AST: 66 U/L — ABNORMAL HIGH (ref 15–41)
AST: 66 U/L — ABNORMAL HIGH (ref 15–41)
Albumin: 2.5 g/dL — ABNORMAL LOW (ref 3.5–5.0)
Albumin: 2.5 g/dL — ABNORMAL LOW (ref 3.5–5.0)
Alkaline Phosphatase: 146 U/L — ABNORMAL HIGH (ref 38–126)
Alkaline Phosphatase: 169 U/L — ABNORMAL HIGH (ref 38–126)
Anion gap: 9 (ref 5–15)
Anion gap: 8 (ref 5–15)
BUN: 11 mg/dL (ref 6–20)
BUN: 6 mg/dL (ref 6–20)
CO2: 19 mmol/L — ABNORMAL LOW (ref 22–32)
CO2: 21 mmol/L — ABNORMAL LOW (ref 22–32)
Calcium: 9.2 mg/dL (ref 8.9–10.3)
Calcium: 8.9 mg/dL (ref 8.9–10.3)
Chloride: 106 mmol/L (ref 98–111)
Chloride: 104 mmol/L (ref 98–111)
Creatinine, Ser: 0.71 mg/dL (ref 0.44–1.00)
Creatinine, Ser: 0.74 mg/dL (ref 0.44–1.00)
GFR, Estimated: >60 mL/min (ref >60)
GFR, Estimated: >60 mL/min (ref >60)
Glucose, Bld: 71 mg/dL (ref 70–99)
Glucose, Bld: 88 mg/dL (ref 70–99)
Potassium: 4.1 mmol/L (ref 3.5–5.1)
Potassium: 3.9 mmol/L (ref 3.5–5.1)
Sodium: 134 mmol/L — ABNORMAL LOW (ref 135–145)
Sodium: 133 mmol/L — ABNORMAL LOW (ref 135–145)
Total Bilirubin: 0.5 mg/dL (ref 0.3–1.2)
Total Bilirubin: 0.9 mg/dL (ref 0.3–1.2)
Total Protein: 6.1 g/dL — ABNORMAL LOW (ref 6.5–8.1)
Total Protein: 6.1 g/dL — ABNORMAL LOW (ref 6.5–8.1)

## 2020-11-10 LAB — RESP PANEL BY RT-PCR (FLU A&B, COVID) ARPGX2
Influenza A by PCR: NEGATIVE
Influenza B by PCR: NEGATIVE
SARS Coronavirus 2 by RT PCR: NEGATIVE

## 2020-11-10 LAB — GLUCOSE, CAPILLARY
Glucose-Capillary: 100 mg/dL — ABNORMAL HIGH (ref 70–99)
Glucose-Capillary: 97 mg/dL (ref 70–99)
Glucose-Capillary: 101 mg/dL — ABNORMAL HIGH (ref 70–99)
Glucose-Capillary: 98 mg/dL (ref 70–99)
Glucose-Capillary: 71 mg/dL (ref 70–99)
Glucose-Capillary: 92 mg/dL (ref 70–99)
Glucose-Capillary: 75 mg/dL (ref 70–99)

## 2020-11-10 LAB — PROTEIN / CREATININE RATIO, URINE
Creatinine, Urine: 56.32 mg/dL
Total Protein, Urine: <6 mg/dL

## 2020-11-10 LAB — RPR: RPR Ser Ql: NONREACTIVE

## 2020-11-10 SURGERY — Surgical Case
Anesthesia: Epidural | Wound class: Clean Contaminated

## 2020-11-10 MED ORDER — MORPHINE SULFATE (PF) 0.5 MG/ML IJ SOLN
INTRAMUSCULAR | Status: DC | PRN
  Administered 2020-11-10: 150 ug via INTRATHECAL

## 2020-11-10 MED ORDER — DEXAMETHASONE SODIUM PHOSPHATE 10 MG/ML IJ SOLN
INTRAMUSCULAR | Status: DC | PRN
  Administered 2020-11-10: 10 mg via INTRAVENOUS

## 2020-11-10 MED ORDER — OXYTOCIN-SODIUM CHLORIDE 30-0.9 UT/500ML-% IV SOLN
2.5000 [IU]/h | INTRAVENOUS | Status: AC
  Administered 2020-11-10: 2.5 [IU]/h via INTRAVENOUS

## 2020-11-10 MED ORDER — LACTATED RINGERS IV SOLN: 500.0000 mL | Freq: Once | INTRAVENOUS | Status: DC

## 2020-11-10 MED ORDER — NALBUPHINE HCL 10 MG/ML IJ SOLN: 5.0000 mg | Freq: Once | INTRAMUSCULAR | Status: DC | PRN

## 2020-11-10 MED ORDER — KETOROLAC TROMETHAMINE 30 MG/ML IJ SOLN: 30.0000 mg | Freq: Four times a day (QID) | INTRAMUSCULAR | Status: DC | PRN

## 2020-11-10 MED ORDER — ONDANSETRON HCL 4 MG/2ML IJ SOLN
INTRAMUSCULAR | Status: DC | PRN
  Administered 2020-11-10: 4 mg via INTRAVENOUS

## 2020-11-10 MED ORDER — OXYCODONE-ACETAMINOPHEN 5-325 MG PO TABS: 2.0000 | ORAL_TABLET | ORAL | Status: DC | PRN

## 2020-11-10 MED ORDER — CLINDAMYCIN PHOSPHATE 900 MG/50ML IV SOLN
INTRAVENOUS | Status: DC | PRN
  Administered 2020-11-10: 900 mg via INTRAVENOUS

## 2020-11-10 MED ORDER — MAGNESIUM HYDROXIDE 400 MG/5ML PO SUSP: 30.0000 mL | ORAL | Status: DC | PRN

## 2020-11-10 MED ORDER — GABAPENTIN 100 MG PO CAPS
300.0000 mg | ORAL_CAPSULE | Freq: Two times a day (BID) | ORAL | Status: DC
  Administered 2020-11-11 – 2020-11-13 (×6): 300 mg via ORAL
  Filled 2020-11-10 (×6): qty 3

## 2020-11-10 MED ORDER — OXYTOCIN-SODIUM CHLORIDE 30-0.9 UT/500ML-% IV SOLN: 2.5000 [IU]/h | INTRAVENOUS | Status: DC

## 2020-11-10 MED ORDER — KETOROLAC TROMETHAMINE 30 MG/ML IJ SOLN
30.0000 mg | Freq: Once | INTRAMUSCULAR | Status: AC
  Administered 2020-11-10: 30 mg via INTRAVENOUS

## 2020-11-10 MED ORDER — DIPHENHYDRAMINE HCL 50 MG/ML IJ SOLN: 12.5000 mg | INTRAMUSCULAR | Status: DC | PRN

## 2020-11-10 MED ORDER — NALBUPHINE HCL 10 MG/ML IJ SOLN
5.0000 mg | INTRAMUSCULAR | Status: DC | PRN
  Administered 2020-11-10: 5 mg via INTRAVENOUS
  Filled 2020-11-10: qty 1

## 2020-11-10 MED ORDER — TERBUTALINE SULFATE 1 MG/ML IJ SOLN: 0.2500 mg | Freq: Once | INTRAMUSCULAR | Status: DC | PRN

## 2020-11-10 MED ORDER — BUPIVACAINE IN DEXTROSE 0.75-8.25 % IT SOLN
INTRATHECAL | Status: DC | PRN
  Administered 2020-11-10: 1.6 mL via INTRATHECAL

## 2020-11-10 MED ORDER — BUPIVACAINE HCL (PF) 0.5 % IJ SOLN
INTRAMUSCULAR | Status: AC
  Filled 2020-11-10: qty 30

## 2020-11-10 MED ORDER — COCONUT OIL OIL: 1.0000 "application " | TOPICAL_OIL | Status: DC | PRN

## 2020-11-10 MED ORDER — LACTATED RINGERS IV SOLN: 500.0000 mL | INTRAVENOUS | Status: DC | PRN

## 2020-11-10 MED ORDER — OXYTOCIN BOLUS FROM INFUSION: 333.0000 mL | Freq: Once | INTRAVENOUS | Status: DC

## 2020-11-10 MED ORDER — KETOROLAC TROMETHAMINE 30 MG/ML IJ SOLN
INTRAMUSCULAR | Status: AC
  Filled 2020-11-10: qty 1

## 2020-11-10 MED ORDER — OXYCODONE HCL 5 MG PO TABS: 5.0000 mg | ORAL_TABLET | Freq: Once | ORAL | Status: DC | PRN

## 2020-11-10 MED ORDER — DIPHENHYDRAMINE HCL 25 MG PO CAPS: 25.0000 mg | ORAL_CAPSULE | ORAL | Status: DC | PRN

## 2020-11-10 MED ORDER — LIDOCAINE HCL (PF) 1 % IJ SOLN: 30.0000 mL | INTRAMUSCULAR | Status: DC | PRN

## 2020-11-10 MED ORDER — KETOROLAC TROMETHAMINE 30 MG/ML IJ SOLN
30.0000 mg | Freq: Four times a day (QID) | INTRAMUSCULAR | Status: DC
  Administered 2020-11-11: 30 mg via INTRAVENOUS
  Filled 2020-11-10: qty 1

## 2020-11-10 MED ORDER — SODIUM CHLORIDE 0.9% FLUSH: 3.0000 mL | INTRAVENOUS | Status: DC | PRN

## 2020-11-10 MED ORDER — ONDANSETRON HCL 4 MG/2ML IJ SOLN: 4.0000 mg | Freq: Four times a day (QID) | INTRAMUSCULAR | Status: DC | PRN

## 2020-11-10 MED ORDER — PHENYLEPHRINE HCL-NACL 20-0.9 MG/250ML-% IV SOLN
INTRAVENOUS | Status: AC
  Filled 2020-11-10: qty 250

## 2020-11-10 MED ORDER — CEFAZOLIN SODIUM-DEXTROSE 2-4 GM/100ML-% IV SOLN
2.0000 g | Freq: Once | INTRAVENOUS | Status: DC
  Filled 2020-11-10: qty 100

## 2020-11-10 MED ORDER — NALOXONE HCL 4 MG/10ML IJ SOLN
1.0000 ug/kg/h | INTRAVENOUS | Status: DC | PRN
  Filled 2020-11-10: qty 5

## 2020-11-10 MED ORDER — FENTANYL CITRATE (PF) 100 MCG/2ML IJ SOLN
INTRAMUSCULAR | Status: AC
  Filled 2020-11-10: qty 2

## 2020-11-10 MED ORDER — FERROUS SULFATE 325 (65 FE) MG PO TABS
325.0000 mg | ORAL_TABLET | ORAL | Status: DC
  Administered 2020-11-11 – 2020-11-13 (×2): 325 mg via ORAL
  Filled 2020-11-10 (×2): qty 1

## 2020-11-10 MED ORDER — MEPERIDINE HCL 25 MG/ML IJ SOLN: 6.2500 mg | INTRAMUSCULAR | Status: DC | PRN

## 2020-11-10 MED ORDER — ATROPINE SULFATE 0.4 MG/ML IJ SOLN
INTRAMUSCULAR | Status: DC | PRN
  Administered 2020-11-10: .2 mg via INTRAVENOUS

## 2020-11-10 MED ORDER — MORPHINE SULFATE (PF) 0.5 MG/ML IJ SOLN
INTRAMUSCULAR | Status: AC
  Filled 2020-11-10: qty 10

## 2020-11-10 MED ORDER — EPHEDRINE 5 MG/ML INJ: 10.0000 mg | INTRAVENOUS | Status: DC | PRN

## 2020-11-10 MED ORDER — DIPHENHYDRAMINE HCL 50 MG/ML IJ SOLN
12.5000 mg | INTRAMUSCULAR | Status: DC | PRN
  Filled 2020-11-10: qty 1

## 2020-11-10 MED ORDER — SODIUM CHLORIDE 0.9 % IV SOLN
2.0000 g | Freq: Once | INTRAVENOUS | Status: AC
  Administered 2020-11-10: 2 mg via INTRAVENOUS
  Filled 2020-11-10: qty 2

## 2020-11-10 MED ORDER — MENTHOL 3 MG MT LOZG: 1.0000 | LOZENGE | OROMUCOSAL | Status: DC | PRN

## 2020-11-10 MED ORDER — FENTANYL CITRATE (PF) 100 MCG/2ML IJ SOLN: 25.0000 ug | INTRAMUSCULAR | Status: DC | PRN

## 2020-11-10 MED ORDER — TRANEXAMIC ACID-NACL 1000-0.7 MG/100ML-% IV SOLN
1000.0000 mg | INTRAVENOUS | Status: AC
  Administered 2020-11-10: 1000 mg via INTRAVENOUS

## 2020-11-10 MED ORDER — SERTRALINE HCL 50 MG PO TABS
50.0000 mg | ORAL_TABLET | Freq: Every day | ORAL | Status: DC
  Filled 2020-11-10: qty 1

## 2020-11-10 MED ORDER — STERILE WATER FOR IRRIGATION IR SOLN
Status: DC | PRN
  Administered 2020-11-10: 1000 mL

## 2020-11-10 MED ORDER — OXYTOCIN-SODIUM CHLORIDE 30-0.9 UT/500ML-% IV SOLN
INTRAVENOUS | Status: AC
  Filled 2020-11-10: qty 500

## 2020-11-10 MED ORDER — CEFAZOLIN SODIUM-DEXTROSE 1-4 GM/50ML-% IV SOLN
1.0000 g | Freq: Three times a day (TID) | INTRAVENOUS | Status: DC
  Filled 2020-11-10: qty 50

## 2020-11-10 MED ORDER — FENTANYL CITRATE (PF) 100 MCG/2ML IJ SOLN
100.0000 ug | INTRAMUSCULAR | Status: DC | PRN
  Administered 2020-11-10 (×2): 100 ug via INTRAVENOUS
  Filled 2020-11-10 (×2): qty 2

## 2020-11-10 MED ORDER — TRANEXAMIC ACID-NACL 1000-0.7 MG/100ML-% IV SOLN
INTRAVENOUS | Status: AC
  Filled 2020-11-10: qty 100

## 2020-11-10 MED ORDER — SCOPOLAMINE 1 MG/3DAYS TD PT72: 1.0000 | MEDICATED_PATCH | Freq: Once | TRANSDERMAL | Status: DC

## 2020-11-10 MED ORDER — NALOXONE HCL 0.4 MG/ML IJ SOLN: 0.4000 mg | INTRAMUSCULAR | Status: DC | PRN

## 2020-11-10 MED ORDER — ALBUTEROL SULFATE (2.5 MG/3ML) 0.083% IN NEBU: 2.5000 mg | INHALATION_SOLUTION | Freq: Four times a day (QID) | RESPIRATORY_TRACT | Status: DC | PRN

## 2020-11-10 MED ORDER — NALBUPHINE HCL 10 MG/ML IJ SOLN: 5.0000 mg | INTRAMUSCULAR | Status: DC | PRN

## 2020-11-10 MED ORDER — ENOXAPARIN SODIUM 60 MG/0.6ML IJ SOSY
0.5000 mg/kg | PREFILLED_SYRINGE | INTRAMUSCULAR | Status: DC
  Administered 2020-11-11 – 2020-11-12 (×2): 60 mg via SUBCUTANEOUS
  Filled 2020-11-10 (×2): qty 0.6

## 2020-11-10 MED ORDER — SODIUM CHLORIDE 0.9 % IR SOLN
Status: DC | PRN
  Administered 2020-11-10: 1000 mL

## 2020-11-10 MED ORDER — ENOXAPARIN SODIUM 40 MG/0.4ML IJ SOSY: 40.0000 mg | PREFILLED_SYRINGE | INTRAMUSCULAR | Status: DC

## 2020-11-10 MED ORDER — HYDROXYZINE HCL 25 MG PO TABS: 25.0000 mg | ORAL_TABLET | Freq: Every day | ORAL | Status: DC | PRN

## 2020-11-10 MED ORDER — PHENYLEPHRINE 40 MCG/ML (10ML) SYRINGE FOR IV PUSH (FOR BLOOD PRESSURE SUPPORT)
80.0000 ug | PREFILLED_SYRINGE | INTRAVENOUS | Status: DC | PRN
  Filled 2020-11-10: qty 10

## 2020-11-10 MED ORDER — FENTANYL CITRATE (PF) 100 MCG/2ML IJ SOLN
INTRAMUSCULAR | Status: DC | PRN
  Administered 2020-11-10: 15 ug via INTRATHECAL

## 2020-11-10 MED ORDER — PHENYLEPHRINE 40 MCG/ML (10ML) SYRINGE FOR IV PUSH (FOR BLOOD PRESSURE SUPPORT): 80.0000 ug | PREFILLED_SYRINGE | INTRAVENOUS | Status: DC | PRN

## 2020-11-10 MED ORDER — LACTATED RINGERS IV SOLN: INTRAVENOUS | Status: DC

## 2020-11-10 MED ORDER — SIMETHICONE 80 MG PO CHEW: 80.0000 mg | CHEWABLE_TABLET | ORAL | Status: DC | PRN

## 2020-11-10 MED ORDER — TETANUS-DIPHTH-ACELL PERTUSSIS 5-2.5-18.5 LF-MCG/0.5 IM SUSY: 0.5000 mL | PREFILLED_SYRINGE | Freq: Once | INTRAMUSCULAR | Status: DC

## 2020-11-10 MED ORDER — VANCOMYCIN HCL IN DEXTROSE 1-5 GM/200ML-% IV SOLN
1000.0000 mg | Freq: Two times a day (BID) | INTRAVENOUS | Status: DC
  Administered 2020-11-10 (×2): 1000 mg via INTRAVENOUS
  Filled 2020-11-10 (×2): qty 200

## 2020-11-10 MED ORDER — SERTRALINE HCL 50 MG PO TABS
50.0000 mg | ORAL_TABLET | Freq: Every day | ORAL | Status: DC
  Administered 2020-11-11 – 2020-11-12 (×2): 50 mg via ORAL
  Filled 2020-11-10 (×5): qty 1

## 2020-11-10 MED ORDER — LEVONORGESTREL 20.1 MCG/DAY IU IUD
1.0000 | INTRAUTERINE_SYSTEM | Freq: Once | INTRAUTERINE | Status: AC
  Administered 2020-11-10: 1 via INTRAUTERINE

## 2020-11-10 MED ORDER — MEPERIDINE HCL 25 MG/ML IJ SOLN
INTRAMUSCULAR | Status: DC | PRN
  Administered 2020-11-10 (×2): 6.25 mg via INTRAVENOUS

## 2020-11-10 MED ORDER — SOD CITRATE-CITRIC ACID 500-334 MG/5ML PO SOLN
30.0000 mL | ORAL | Status: DC | PRN
  Administered 2020-11-10: 30 mL via ORAL
  Filled 2020-11-10: qty 30

## 2020-11-10 MED ORDER — IBUPROFEN 600 MG PO TABS: 600.0000 mg | ORAL_TABLET | Freq: Four times a day (QID) | ORAL | Status: DC

## 2020-11-10 MED ORDER — OXYCODONE HCL 5 MG PO TABS
5.0000 mg | ORAL_TABLET | ORAL | Status: DC | PRN
  Administered 2020-11-11 – 2020-11-12 (×2): 5 mg via ORAL
  Administered 2020-11-12: 10 mg via ORAL
  Administered 2020-11-13: 5 mg via ORAL
  Administered 2020-11-13: 10 mg via ORAL
  Filled 2020-11-10 (×2): qty 2
  Filled 2020-11-10: qty 1
  Filled 2020-11-10: qty 2
  Filled 2020-11-10 (×2): qty 1

## 2020-11-10 MED ORDER — BUPIVACAINE-EPINEPHRINE (PF) 0.5% -1:200000 IJ SOLN
INTRAMUSCULAR | Status: DC | PRN
  Administered 2020-11-10: 30 mL via PERINEURAL

## 2020-11-10 MED ORDER — ONDANSETRON HCL 4 MG/2ML IJ SOLN: 4.0000 mg | Freq: Three times a day (TID) | INTRAMUSCULAR | Status: DC | PRN

## 2020-11-10 MED ORDER — WITCH HAZEL-GLYCERIN EX PADS: 1.0000 "application " | MEDICATED_PAD | CUTANEOUS | Status: DC | PRN

## 2020-11-10 MED ORDER — FENTANYL-BUPIVACAINE-NACL 0.5-0.125-0.9 MG/250ML-% EP SOLN
12.0000 mL/h | EPIDURAL | Status: DC | PRN
  Administered 2020-11-10: 12 mL/h via EPIDURAL
  Filled 2020-11-10: qty 250

## 2020-11-10 MED ORDER — LIDOCAINE-EPINEPHRINE (PF) 1.5 %-1:200000 IJ SOLN
INTRAMUSCULAR | Status: DC | PRN
  Administered 2020-11-10: 3 mL via EPIDURAL

## 2020-11-10 MED ORDER — DIPHENHYDRAMINE HCL 25 MG PO CAPS: 25.0000 mg | ORAL_CAPSULE | Freq: Four times a day (QID) | ORAL | Status: DC | PRN

## 2020-11-10 MED ORDER — LIDOCAINE HCL (PF) 1 % IJ SOLN
INTRAMUSCULAR | Status: DC | PRN
  Administered 2020-11-10: 5 mL via EPIDURAL

## 2020-11-10 MED ORDER — OXYCODONE-ACETAMINOPHEN 5-325 MG PO TABS: 1.0000 | ORAL_TABLET | ORAL | Status: DC | PRN

## 2020-11-10 MED ORDER — PRENATAL MULTIVITAMIN CH
1.0000 | ORAL_TABLET | Freq: Every day | ORAL | Status: DC
  Administered 2020-11-11 – 2020-11-12 (×2): 1 via ORAL
  Filled 2020-11-10 (×2): qty 1

## 2020-11-10 MED ORDER — ZOLPIDEM TARTRATE 5 MG PO TABS: 5.0000 mg | ORAL_TABLET | Freq: Every evening | ORAL | Status: DC | PRN

## 2020-11-10 MED ORDER — SENNOSIDES-DOCUSATE SODIUM 8.6-50 MG PO TABS
2.0000 | ORAL_TABLET | Freq: Every day | ORAL | Status: DC
  Administered 2020-11-11 – 2020-11-13 (×2): 2 via ORAL
  Filled 2020-11-10 (×2): qty 2

## 2020-11-10 MED ORDER — MEPERIDINE HCL 25 MG/ML IJ SOLN
INTRAMUSCULAR | Status: AC
  Filled 2020-11-10: qty 1

## 2020-11-10 MED ORDER — LEVONORGESTREL 20.1 MCG/DAY IU IUD
INTRAUTERINE_SYSTEM | INTRAUTERINE | Status: AC
  Filled 2020-11-10: qty 1

## 2020-11-10 MED ORDER — DIPHENHYDRAMINE HCL 50 MG/ML IJ SOLN
25.0000 mg | Freq: Four times a day (QID) | INTRAMUSCULAR | Status: DC | PRN
  Administered 2020-11-10 (×2): 25 mg via INTRAVENOUS
  Filled 2020-11-10: qty 1

## 2020-11-10 MED ORDER — ACETAMINOPHEN 325 MG PO TABS: 650.0000 mg | ORAL_TABLET | ORAL | Status: DC | PRN

## 2020-11-10 MED ORDER — ALBUTEROL SULFATE HFA 108 (90 BASE) MCG/ACT IN AERS: 2.0000 | INHALATION_SPRAY | Freq: Four times a day (QID) | RESPIRATORY_TRACT | Status: DC | PRN

## 2020-11-10 MED ORDER — OXYTOCIN-SODIUM CHLORIDE 30-0.9 UT/500ML-% IV SOLN
1.0000 m[IU]/min | INTRAVENOUS | Status: DC
  Administered 2020-11-10: 2 m[IU]/min via INTRAVENOUS
  Administered 2020-11-10: 30 [IU] via INTRAVENOUS
  Filled 2020-11-10: qty 500

## 2020-11-10 MED ORDER — OXYCODONE HCL 5 MG/5ML PO SOLN: 5.0000 mg | Freq: Once | ORAL | Status: DC | PRN

## 2020-11-10 MED ORDER — PHENYLEPHRINE HCL-NACL 20-0.9 MG/250ML-% IV SOLN
INTRAVENOUS | Status: DC | PRN
  Administered 2020-11-10: 60 ug/min via INTRAVENOUS

## 2020-11-10 MED ORDER — DIBUCAINE (PERIANAL) 1 % EX OINT: 1.0000 "application " | TOPICAL_OINTMENT | CUTANEOUS | Status: DC | PRN

## 2020-11-10 MED ORDER — HYDROMORPHONE HCL 1 MG/ML IJ SOLN: 1.0000 mg | INTRAMUSCULAR | Status: DC | PRN

## 2020-11-10 MED ORDER — ACETAMINOPHEN 500 MG PO TABS
1000.0000 mg | ORAL_TABLET | Freq: Four times a day (QID) | ORAL | Status: DC
  Administered 2020-11-11 – 2020-11-13 (×9): 1000 mg via ORAL
  Filled 2020-11-10 (×10): qty 2

## 2020-11-10 SURGICAL SUPPLY — 37 items
BENZOIN TINCTURE PRP APPL 2/3 (GAUZE/BANDAGES/DRESSINGS) ×2 IMPLANT
CANISTER WOUND CARE 500ML ATS (WOUND CARE) ×2 IMPLANT
CHLORAPREP W/TINT 26ML (MISCELLANEOUS) ×2 IMPLANT
CLAMP CORD UMBIL (MISCELLANEOUS) IMPLANT
CLOSURE STERI STRIP 1/2 X4 (GAUZE/BANDAGES/DRESSINGS) ×2 IMPLANT
CLOTH BEACON ORANGE TIMEOUT ST (SAFETY) ×2 IMPLANT
DRESSING PREVENA PLUS CUSTOM (GAUZE/BANDAGES/DRESSINGS) ×1 IMPLANT
DRSG OPSITE POSTOP 4X10 (GAUZE/BANDAGES/DRESSINGS) ×2 IMPLANT
DRSG PREVENA PLUS CUSTOM (GAUZE/BANDAGES/DRESSINGS) ×2
ELECT REM PT RETURN 9FT ADLT (ELECTROSURGICAL) ×2
ELECTRODE REM PT RTRN 9FT ADLT (ELECTROSURGICAL) ×1 IMPLANT
EXTRACTOR VACUUM M CUP 4 TUBE (SUCTIONS) IMPLANT
GLOVE BIOGEL PI IND STRL 7.0 (GLOVE) ×3 IMPLANT
GLOVE BIOGEL PI INDICATOR 7.0 (GLOVE) ×3
GLOVE ECLIPSE 7.0 STRL STRAW (GLOVE) ×2 IMPLANT
GOWN STRL REUS W/TWL LRG LVL3 (GOWN DISPOSABLE) ×4 IMPLANT
KIT ABG SYR 3ML LUER SLIP (SYRINGE) IMPLANT
MAT PREVALON FULL STRYKER (MISCELLANEOUS) ×2 IMPLANT
NEEDLE HYPO 22GX1.5 SAFETY (NEEDLE) ×2 IMPLANT
NEEDLE HYPO 25X5/8 SAFETYGLIDE (NEEDLE) ×2 IMPLANT
NS IRRIG 1000ML POUR BTL (IV SOLUTION) ×2 IMPLANT
PACK C SECTION WH (CUSTOM PROCEDURE TRAY) ×2 IMPLANT
PAD ABD 7.5X8 STRL (GAUZE/BANDAGES/DRESSINGS) ×2 IMPLANT
PAD OB MATERNITY 4.3X12.25 (PERSONAL CARE ITEMS) ×2 IMPLANT
PENCIL SMOKE EVAC W/HOLSTER (ELECTROSURGICAL) ×2 IMPLANT
RETRACTOR TRAXI PANNICULUS (MISCELLANEOUS) ×1 IMPLANT
RTRCTR C-SECT PINK 25CM LRG (MISCELLANEOUS) IMPLANT
SUT PDS AB 0 CTX 36 PDP370T (SUTURE) IMPLANT
SUT PLAIN 2 0 XLH (SUTURE) IMPLANT
SUT VIC AB 0 CTX 36 (SUTURE) ×2
SUT VIC AB 0 CTX36XBRD ANBCTRL (SUTURE) ×2 IMPLANT
SUT VIC AB 4-0 KS 27 (SUTURE) ×2 IMPLANT
SYR CONTROL 10ML LL (SYRINGE) ×2 IMPLANT
TOWEL OR 17X24 6PK STRL BLUE (TOWEL DISPOSABLE) ×2 IMPLANT
TRAXI PANNICULUS RETRACTOR (MISCELLANEOUS) ×1
TRAY FOLEY W/BAG SLVR 14FR LF (SET/KITS/TRAYS/PACK) ×2 IMPLANT
WATER STERILE IRR 1000ML POUR (IV SOLUTION) ×2 IMPLANT

## 2020-11-10 NOTE — Progress Notes (Signed)
S: She reports that she feels pain from contractions, worried about epidural working. She is also asking for a repeat cesarean section given lack of progress.  O: Vitals:   11/10/20 1800 11/10/20 1830 11/10/20 1905 11/10/20 1924  BP: 114/63 124/60 111/61   Pulse: 67 71 66   Resp:    20  Temp:    98.7 F (37.1 C)  TempSrc:      SpO2:      Weight:      Height:       CBG (last 3)  Recent Labs    11/10/20 0952 11/10/20 1420 11/10/20 1849  GLUCAP 101* 98 71   FHT:  FHR: 145 bpm, variability: moderate,  accelerations:  Present,  decelerations:  occasional mild variable UC:   regular, every 2-3 minutes SVE:   Dilation: 4.5 Effacement (%): 50 Station: -3 Exam by:: Dr. Macon Large Cervical swelling noted MVU's: Range 200  A / P: Induction of labor due to A1 gestational diabetes, no change in cervical exam  despite adequate contractions for two hours. Offered continued trial of induction for a few hours, but patient declined this. Desires cesarean section, also wants progestin IUD placement.  The risks of surgery were discussed with the patient including but were not limited to: bleeding which may require transfusion or reoperation; infection which may require antibiotics; injury to bowel, bladder, ureters or other surrounding organs; injury to the fetus; need for additional procedures including hysterectomy in the event of a life-threatening hemorrhage; formation of adhesions; placental abnormalities wth subsequent pregnancies; incisional problems; thromboembolic phenomenon and other postoperative/anesthesia complications.   For the postplacental IUD placement, discussed risks of postpartum expulsion, irregular bleeding, cramping, infection, malpositioning or misplacement of the IUD which may require further procedures. Also discussed >99% contraception efficacy, increased risk of ectopic pregnancy with failure of method.   The patient concurred with the proposed plan, giving informed written  consent for the procedures.  Anesthesia and OR aware. Preoperative prophylactic antibiotics (Aztreonam 2 g  IV  and Clindamycin 900 mg IV ordered as per Pharmacy consult as she has multiple severe allergies) and SCDs ordered on call to the OR.  To OR when ready. Fetal Wellbeing: Mostly Category I, overall reassuring. Pain Control:  Epidural   Jaynie Collins, MD Center for [facility], [facility] 11/10/2020 8:09 PM

## 2020-11-10 NOTE — Discharge Summary (Signed)
Postpartum Discharge Summary  Date of Service updated 11/13/20     Patient Name: Kimberly Houston DOB: October 17, 1995 MRN: [id number]  Date of admission: 11/10/2020 Delivery date:11/10/2020  Delivering provider: Verita Schneiders A  Date of discharge: 11/13/2020  Admitting diagnosis: Supervision of high risk pregnancy, antepartum [O09.90] Intrauterine pregnancy: [redacted]w[redacted]d    Secondary diagnosis:  Principal Problem:   IUD (intrauterine device) in place Active Problems:   History of cesarean section   Rh negative state in antepartum period   Smoker   Depression with anxiety   Gestational diabetes   Supervision of high risk pregnancy, antepartum   Encounter for insertion of progestin-releasing intrauterine contraceptive device (IUD)  Additional problems: as noted above   Discharge diagnosis: Cesarean delivery delivered                            Post partum procedures:post-placental Liletta IUD insertion, rhogam Augmentation: Pitocin and IP Foley Complications: None  Hospital course: Induction of Labor With Cesarean Section   25y.o. yo G2P2002 at 367w2das admitted to the hospital 11/10/2020 for induction of labor secondary to A1GDM [facility] and [facility] was started. Pt had unchanged cervical dilation at 5cm and elected for a repeat Cesarean. The patient went for cesarean section due to  arrest of dilation . Delivery details are as follows: Membrane Rupture Time/Date: 1:45 AM ,11/10/2020   Delivery Method:C-Section, Low Transverse  Details of operation can be found in separate operative Note. Post-placental Liletta IUD was placed. Patient had an uncomplicated postpartum course. She is ambulating, tolerating a regular diet, passing flatus, and urinating well.  Patient is discharged home in stable condition on 11/13/20.      Newborn Data: Birth date:11/10/2020  Birth time:9:16 PM  Gender:Female  Living status:Living  Apgars:8 ,9   Weight:3235 g                                Magnesium Sulfate received: No BMZ received: No Rhophylac:Yes MMR:N/A T-DaP:offered prior to discharge Flu: offered prior to discharge Transfusion:No  Physical exam  Vitals:   11/12/20 0620 11/12/20 1615 11/12/20 2040 11/13/20 0500  BP: (!) 113/54 110/87 126/69 118/70  Pulse: (!) 56  65 77  Resp: '18 20 18 20  ' Temp: 97.7 F (36.5 C)  98.1 F (36.7 C) 98.3 F (36.8 C)  TempSrc: Oral  Oral Oral  SpO2: 100%     Weight:      Height:       General: alert, cooperative, and no distress Lochia: appropriate Uterine Fundus: firm Incision: Healing well with no significant drainage, No significant erythema, Prevena wound vac with good suction. DVT Evaluation: No evidence of DVT seen on physical exam. No cords or calf tenderness. No significant calf/ankle edema. Labs: Lab Results  Component Value Date   WBC 15.7 (H) 11/11/2020   HGB 9.6 (L) 11/11/2020   HCT 29.7 (L) 11/11/2020   MCV 88.9 11/11/2020   PLT 280 11/11/2020   CMP Latest Ref Rng & Units 11/11/2020  Glucose 70 - 99 mg/dL 182(H)  BUN 6 - 20 mg/dL 7  Creatinine 0.44 - 1.00 mg/dL 0.76  Sodium 135 - 145 mmol/L 132(L)  Potassium 3.5 - 5.1 mmol/L 4.4  Chloride 98 - 111 mmol/L 105  CO2 22 - 32 mmol/L 19(L)  Calcium 8.9 - 10.3  mg/dL 8.6(L)  Total Protein 6.5 - 8.1 g/dL 5.0(L)  Total Bilirubin 0.3 - 1.2 mg/dL 0.7  Alkaline Phos 38 - 126 U/L 118  AST 15 - 41 U/L 54(H)  ALT 0 - 44 U/L 71(H)   Flavia Shipper Score: Edinburgh Postnatal Depression Scale Screening Tool 11/11/2020  I have been able to laugh and see the funny side of things. 3  I have looked forward with enjoyment to things. 3  I have blamed myself unnecessarily when things went wrong. 0  I have been anxious or worried for no good reason. 2  I have felt scared or panicky for no good reason. 0  Things have been getting on top of me. 0  I have been so unhappy that I have had difficulty sleeping. 0  I have felt sad or  miserable. 0  I have been so unhappy that I have been crying. 1  The thought of harming myself has occurred to me. 0  Edinburgh Postnatal Depression Scale Total 9     After visit meds:  Allergies as of 11/13/2020       Reactions   Amoxicillin Hives   Drug Ingredient [azithromycin] Hives   Erythromycin Hives   Gentamicin Hives   Metronidazole Hives   Penicillins Hives   Latex Rash        Medication List     STOP taking these medications    Accu-Chek Guide Me w/Device Kit   Accu-Chek Guide test strip Generic drug: glucose blood   Accu-Chek Softclix Lancets lancets   acetaminophen 500 MG tablet Commonly known as: TYLENOL   aspirin 81 MG EC tablet   Blood Pressure Monitor Misc   cyclobenzaprine 10 MG tablet Commonly known as: FLEXERIL   metFORMIN 500 MG tablet Commonly known as: Glucophage   multivitamin Chew chewable tablet   promethazine 25 MG tablet Commonly known as: PHENERGAN       TAKE these medications    albuterol 108 (90 Base) MCG/ACT inhaler Commonly known as: VENTOLIN HFA Inhale 2 puffs into the lungs every 6 (six) hours as needed for wheezing or shortness of breath.   ferrous sulfate 325 (65 FE) MG tablet Take 1 tablet (325 mg total) by mouth every other day.   hydrOXYzine 25 MG tablet Commonly known as: ATARAX/VISTARIL Take 1 tablet (25 mg total) by mouth daily as needed for itching (allergies).   ibuprofen 600 MG tablet Commonly known as: ADVIL Take 1 tablet (600 mg total) by mouth every 6 (six) hours.   oxyCODONE-acetaminophen 5-325 MG tablet Commonly known as: PERCOCET/ROXICET Take 1 tablet by mouth every 4 (four) hours as needed for severe pain or moderate pain ((when tolerating fluids)).   prenatal multivitamin Tabs tablet Take 1 tablet by mouth daily at 12 noon.   sertraline 50 MG tablet Commonly known as: Zoloft Take 1 tablet (50 mg total) by mouth daily.         Discharge home in stable condition Infant Feeding:   breast and bottle Infant Disposition:home with mother Discharge instruction: per After Visit Summary and Postpartum booklet. Activity: Advance as tolerated. Pelvic rest for 6 weeks.  Diet: routine diet Future Appointments: Future Appointments  Date Time Provider [place]  11/21/2020  2:30 PM Kimberly Houston, [place] [facility]  12/17/2020 11:50 AM Gertie Exon Royetta Crochet, CNM [facility]   Follow up Visit:  Follow-up Information     [facility] Follow up.   Specialty: Obstetrics and Gynecology Why: as scheduled Contact information: [address] [place] [phone]               Message sent to [facility] by Dr. Astrid Drafts.  Please schedule this patient for a In person postpartum visit in 6 weeks with the following provider: Any provider. Additional Postpartum F/U:Postpartum Depression checkup, 2 hour GTT, Incision check 1 week, and BP check 1 week  High risk pregnancy complicated by:  M4QAS, h/o gHTN, Tobacco Use, Elevated LFTs, Repeat Cesarean s/p failed TOLAC Delivery mode:  C-Section, Low Transverse  Anticipated Birth Control:  PP IUD placed   11/13/2020 Kimberly Houston, CNM

## 2020-11-10 NOTE — Anesthesia Preprocedure Evaluation (Signed)
Anesthesia Evaluation  Patient identified by MRN, date of birth, ID band Patient awake    Reviewed: Allergy & Precautions, H&P , NPO status , Patient's Chart, lab work & pertinent test results  Airway Mallampati: II   Neck ROM: full    Dental   Pulmonary asthma , former smoker,    breath sounds clear to auscultation       Cardiovascular hypertension,  Rhythm:regular Rate:Normal     Neuro/Psych PSYCHIATRIC DISORDERS Anxiety Depression    GI/Hepatic   Endo/Other  diabetes, Type obesity  Renal/GU      Musculoskeletal   Abdominal   Peds  Hematology   Anesthesia Other Findings   Reproductive/Obstetrics (+) Pregnancy                             Anesthesia Physical Anesthesia Plan  ASA: 2  Anesthesia Plan: Epidural   Post-op Pain Management:    Induction: Intravenous  PONV Risk Score and Plan: 2 and Treatment may vary due to age or medical condition  Airway Management Planned: Natural Airway  Additional Equipment:   Intra-op Plan:   Post-operative Plan:   Informed Consent: I have reviewed the patients History and Physical, chart, labs and discussed the procedure including the risks, benefits and alternatives for the proposed anesthesia with the patient or authorized representative who has indicated his/her understanding and acceptance.       Plan Discussed with: Anesthesiologist  Anesthesia Plan Comments:         Anesthesia Quick Evaluation

## 2020-11-10 NOTE — Progress Notes (Addendum)
S: Kimberly Houston is well. She remains comfortable with epidural. She endorses intermittent vaginal pressure. She is continuing to cope well with the labor process.   O: Vitals:   11/10/20 1700 11/10/20 1730 11/10/20 1800 11/10/20 1830  BP: 112/60 (!) 116/51 114/63 124/60  Pulse: 64 60 67 71  Resp: 18     Temp: 98.1 F (36.7 C)     TempSrc: Oral     SpO2:      Weight:      Height:       CBG (last 3)  Recent Labs    11/10/20 0952 11/10/20 1420 11/10/20 1849  GLUCAP 101* 98 71   CBC Latest Ref Rng & Units 11/10/2020 11/10/2020 10/04/2020  WBC 4.0 - 10.5 K/uL 12.1(H) 8.9 10.5  Hemoglobin 12.0 - 15.0 g/dL 33.5 11.3(L) 11.1(L)  Hematocrit 36.0 - 46.0 % 37.4 35.0(L) 34.2(L)  Platelets 150 - 400 K/uL 329 332 354    CMP Latest Ref Rng & Units 11/10/2020 11/10/2020 09/27/2020  Glucose 70 - 99 mg/dL 88 71 74  BUN 6 - 20 mg/dL 6 11 6   Creatinine 0.44 - 1.00 mg/dL 4.56 2.56  Sodium 135 - 145 mmol/L 133(L) 134(L) 134  Potassium 3.5 - 5.1 mmol/L 3.9 4.1 4.4  Chloride 98 - 111 mmol/L 104 106 99  CO2 22 - 32 mmol/L 21(L) 19(L) 19(L)  Calcium 8.9 - 10.3 mg/dL 8.9 9.2 9.0  Total Protein 6.5 - 8.1 g/dL 6.1(L) 6.1(L) 5.8(L)  Total Bilirubin 0.3 - 1.2 mg/dL 0.9 0.5 0.6  Alkaline Phos 38 - 126 U/L 169(H) 146(H) 116  AST 15 - 41 U/L 66(H) 66(H) 19  ALT 0 - 44 U/L 86(H) 88(H) 20     FHT:  FHR: 140 bpm, variability: moderate,  accelerations:  Present,  decelerations:  Present occasional variable decelerations  UC:   regular, every 2-3 minutes SVE:   Dilation: 5 Effacement (%): 50 Station: -3 Exam by:: lee MVU's: 180-210   A / P: Induction of labor due to A1GDM, minimal cervical change on 16 miliunits of pitocin. SNM called to bedside, pt feeling "more pressure". SVE by RN revealed minimal cervical change.  MVU's became adequate starting at 1815. Ranging from 180-210.  Continue to titrate pitocin PRN.  Elevated LFTs upon admission. Hx of GHTN. Repeat PIH labs- stable  A1GDM- q4 CBG  Fetal  Wellbeing:  Category I and Category II Pain Control:  Epidural  Anticipated MOD:   Caution for NSVD. Hx of PLTSC for NRFHT. Labor progression up to 4cm on 25 of pit in previous Pregnancy.   [place] [phone]) Danella Deis, BSN, RNC-OB  Student Nurse-Midwife   11/10/2020  7:04 PM  Attestation of Supervision of Student:  I confirm that I have verified the information documented in the nurse midwife student's note and that I have also personally reperformed the history, physical exam and all medical decision making activities.  I have verified that all services and findings are accurately documented in this student's note; and I agree with management and plan as outlined in the documentation. I have also made any necessary editorial changes.   11/12/2020, CNM [facility] for [name], [facility] 11/11/2020 9:28 AM

## 2020-11-10 NOTE — H&P (Signed)
OBSTETRIC ADMISSION HISTORY AND PHYSICAL  Kimberly Houston is a 25 y.o. female G2P1001 with IUP at 37w2dby L/10 presenting for IOL secondary to [facility]GDM [facility] She reports +FMs, No LOF, no VB, no blurry vision, headaches or peripheral edema, and RUQ pain.  She plans on breast and formula feeding. She requests post-placental IUD for birth control. [facility]  Dating: By L/10 --->  Estimated Date of Delivery: 11/15/20  Sono:  _0 , CWD, normal anatomy, cephalic presentation,  [id number] 33% EFW  Prenatal History/Complications:  - h/o Cesarean x1 (failed IOL, NRFHTs) - A1GDM  - Rh negative (rhogam in pregnancy) - Anxiety/Depression (zoloft 578mqHS in pregnancy) - Fetal arrhythmia - Tobacco Use (has cut back to several cigarettes daily) - Asthma (albuterol prn)  Past Medical History: Past Medical History:  Diagnosis Date   Anxiety    Asthma    Depression    Gestational diabetes    Pregnancy induced hypertension     Past Surgical History: Past Surgical History:  Procedure Laterality Date   c section     CESAREAN SECTION     TYMPANOSTOMY TUBE PLACEMENT  2018    Obstetrical History: OB History     Gravida  2   Para  1   Term  1   Preterm      AB      Living  1      SAB      IAB      Ectopic      Multiple      Live Births  1           Social History Social History   Socioeconomic History   Marital status: Single    Spouse name: Not on file   Number of children: Not on file   Years of education: Not on file   Highest education level: Not on file  Occupational History   Occupation: unemployed  Tobacco Use   Smoking status: Former    Packs/day: 0.50    Years: 6.00    Pack years: 3.00    Types: Cigarettes   Smokeless tobacco: Never   Tobacco comments:    smokes 1 cig daily  Vaping Use   Vaping Use: Some days  Substance and Sexual Activity   Alcohol use: Not Currently    Comment:  "occasionally every now and then I will drink a wine cooler" - as of 08/01/2020   Drug use: Not Currently   Sexual activity: Yes    Birth control/protection: None  Other Topics Concern   Not on file  Social History Narrative   Not on file   Social Determinants of Health   Financial Resource Strain: Low Risk    Difficulty of Paying Living Expenses: Not hard at all  Food Insecurity: No Food Insecurity   Worried About RuCharity fundraisern the Last Year: Never true   Ran Out of Food in the Last Year: Never true  Transportation Needs: No Transportation Needs   Lack of Transportation (Medical): No   Lack of Transportation (Non-Medical): No  Physical Activity: Sufficiently Active   Days of Exercise per Week: 4 days   Minutes of Exercise per Session: 60 min  Stress: Stress Concern Present   Feeling of Stress : Rather much  Social Connections: Moderately Integrated   Frequency of Communication with Friends and Family: More than three times a week   Frequency of  Social Gatherings with Friends and Family: More than three times a week   Attends Religious Services: 1 to 4 times per year   Active Member of [employer] or Organizations: No   Attends Music therapist: Never   Marital Status: Living with partner    Family History: Family History  Problem Relation Age of Onset   Heart attack Paternal Grandfather    COPD Paternal Grandmother    Asthma Paternal Grandmother    Hypertension Maternal Grandfather    Diabetes Maternal Grandfather    Seizures Father    Alcohol abuse Father    Other Mother        deg. disc disease    Allergies: Allergies  Allergen Reactions   Amoxicillin Hives   Drug Ingredient [Azithromycin] Hives   Erythromycin Hives   Gentamicin Hives   Metronidazole Hives   Penicillins Hives   Latex Rash    Medications Prior to Admission  Medication Sig Dispense Refill Last Dose   aspirin 81 MG EC tablet Take 2 tablets (162 mg total) by mouth daily.  Swallow whole. 180 tablet 2 11/09/2020   sertraline (ZOLOFT) 50 MG tablet Take 1 tablet (50 mg total) by mouth daily. 90 tablet 3 11/09/2020   Accu-Chek Softclix Lancets lancets Use as instructed to check blood sugar 4 times daily 100 each 12    acetaminophen (TYLENOL) 500 MG tablet Take 500 mg by mouth every 6 (six) hours as needed.      albuterol (VENTOLIN HFA) 108 (90 Base) MCG/ACT inhaler Inhale 2 puffs into the lungs every 6 (six) hours as needed for wheezing or shortness of breath. 1 each 3    Blood Glucose Monitoring Suppl (ACCU-CHEK GUIDE ME) w/Device KIT 1 each by Does not apply route 4 (four) times daily. 1 kit 0    Blood Pressure Monitor MISC For regular home bp monitoring during pregnancy 1 each 0    cyclobenzaprine (FLEXERIL) 10 MG tablet Take 1 tablet (10 mg total) by mouth 2 (two) times daily as needed for muscle spasms. 20 tablet 0    glucose blood (ACCU-CHEK GUIDE) test strip Use as instructed to check blood sugar 4 times daily 50 each 12    hydrOXYzine (ATARAX/VISTARIL) 25 MG tablet Take 1 tablet (25 mg total) by mouth daily as needed for itching (allergies). 30 tablet 0    metFORMIN (GLUCOPHAGE) 500 MG tablet Take 1 tablet (500 mg total) by mouth daily with breakfast. (Patient not taking: Reported on 11/07/2020) 30 tablet 6    multivitamin (VIT W/EXTRA C) CHEW chewable tablet Chew 12 tablets by mouth daily. (Patient not taking: No sig reported)      promethazine (PHENERGAN) 25 MG tablet Take 1 tablet (25 mg total) by mouth every 6 (six) hours as needed for nausea or vomiting. (Patient not taking: Reported on 11/07/2020) 30 tablet 1      Review of Systems   All systems reviewed and negative except as stated in HPI  Last menstrual period 02/09/2020. General appearance: alert, cooperative, and appears stated age Lungs: normal WOB Heart: regular rate  Abdomen: soft, non-tender Extremities: no sign of DVT Presentation: cephalic Fetal monitoringBaseline: 120 bpm, Variability: Good  {> 6 bpm), Accelerations: Reactive, and Decelerations: Absent Uterine activityFrequency: Every 3-4 minutes   Prenatal labs: ABO, Rh: --/--/A NEG (03/17 1912) Antibody: Positive, See Final Results (04/29 0826) Rubella: 1.19 (12/17 1055) RPR: Non Reactive (04/29 0826)  HBsAg: Negative (12/17 1055)  HIV: Non Reactive (04/29 0826)  GBS: --Tessie Fass (06/07 1545)  2 hr Glucola 81/203/117 Genetic screening  wnl Anatomy US wnl  Prenatal Transfer Tool  Maternal Diabetes: Yes:  Diabetes Type:  Insulin/Medication controlled Genetic Screening: Normal Maternal Ultrasounds/Referrals: Normal Fetal Ultrasounds or other Referrals:  Referred to Materal Fetal Medicine , Fetal echo? Maternal Substance Abuse:  Yes:  Type: Smoker Significant Maternal Medications:  Meds include: Other: zoloft Significant Maternal Lab Results: Group B Strep positive  No results found for this or any previous visit (from the past 24 hour(s)).  Patient Active Problem List   Diagnosis Date Noted   Supervision of high risk pregnancy, antepartum 11/10/2020   Gestational diabetes 10/03/2020   Abnormal Pap smear of cervix 05/08/2020   History of gestational hypertension 05/06/2020   History of gestational diabetes 05/06/2020   Smoker 05/06/2020   Depression with anxiety 05/06/2020   Supervision of high-risk pregnancy 05/03/2020   History of cesarean section 04/17/2020   Rh negative state in antepartum period 10/06/2018   Conductive hearing loss of both ears 09/17/2015   Eustachian tube dysfunction, bilateral 09/17/2015   Attention deficit disorder 01/19/2014    Assessment/Plan:  Kimberly Houston is a 25 y.o. G2P1001 at 62w2dhere for IOL secondary to A2GDM [facility]  #T[facility]  H/o Cesarean x1: FB placed on admission at which time ROM occurred for clear fluid. Will plan to start pitocin and up-titrate as clinically indicated.  Reviewed risks/benefits of TOLAC versus RCS in detail. Patient  counseled regarding potential vaginal delivery, chance of success, future implications, possible uterine rupture and need for urgent/emergent repeat cesarean. Counseled regarding potential need for repeat c-section for reasons unrelated to first c-section. Counseled regarding scheduled repeat cesarean including risks of bleeding, infection, damage to surrounding tissue, abnormal placentation, implications for future pregnancies. All questions answered.  Patient desires TOLAC, consent signed 09/13/20.  #Pain: TBD per pt preference #FWB: Category 1 strip #ID: GBS+; started vancomycin on admission given pt's known allergies #MOF: breast & formula #MOC: desires post-placental IUD; pt consented on admission #Circ: desired #A1GDM: Plan for q4hr BG checks in latent labor, then q2hr BG checks in active labor. #Rh negative: plan for rhogam workup s/p delivery #Tobacco use: pt reports smoking several cigarettes daily. Declines nicotine patch/gum/lozenges. #Anxiety/Depression: continue home zoloft 553mqHS.  AnRanda NgoMD  11/10/2020, 12:53 AM

## 2020-11-10 NOTE — Anesthesia Procedure Notes (Signed)
Spinal  Patient location during procedure: OR Start time: 11/10/2020 8:40 PM End time: 11/10/2020 8:43 PM Reason for block: surgical anesthesia Staffing Performed: anesthesiologist  Anesthesiologist: Achille Rich, MD Preanesthetic Checklist Completed: patient identified, IV checked, risks and benefits discussed, surgical consent, monitors and equipment checked, pre-op evaluation and timeout performed Spinal Block Patient position: sitting Prep: DuraPrep Patient monitoring: cardiac monitor, continuous pulse ox and blood pressure Approach: midline Location: L3-4 Injection technique: single-shot Needle Needle type: Pencan  Needle gauge: 22 G Needle length: 12.7 cm Assessment Sensory level: T10 Events: CSF return Additional Notes Functioning IV was confirmed and monitors were applied. Sterile prep and drape, including hand hygiene and sterile gloves were used. The patient was positioned and the spine was prepped. The skin was anesthetized with lidocaine.  Free flow of clear CSF was obtained prior to injecting local anesthetic into the CSF.  The spinal needle aspirated freely following injection.  The needle was carefully withdrawn.  The patient tolerated the procedure well.

## 2020-11-10 NOTE — Op Note (Signed)
Kimberly Houston PROCEDURE DATE: 11/10/2020  PREOPERATIVE DIAGNOSES: Intrauterine pregnancy at [redacted]w[redacted]d weeks gestation; previous cesarean section; failed induction/trial of labor for diet-controlled gestational diabetes; failure to progress: arrest of dilation and desire for long term reversible contraception  POSTOPERATIVE DIAGNOSES: The same  PROCEDURE: Repeat Low Transverse Cesarean Section and Liletta Intrauterine Device Placement  SURGEON:  Dr. Jaynie Collins  ASSISTANT:  Dr. Lynnda Shields  ANESTHESIOLOGY TEAM: Anesthesiologist: Achille Rich, MD  INDICATIONS: Kimberly Houston is a 25 y.o. G2P1001 at [redacted]w[redacted]d here for cesarean section secondary to the indications listed under preoperative diagnoses; please see preoperative note for further details.  The risks of surgery were discussed with the patient including but were not limited to: bleeding which may require transfusion or reoperation; infection which may require antibiotics; injury to bowel, bladder, ureters or other surrounding organs; injury to the fetus; need for additional procedures including hysterectomy in the event of a life-threatening hemorrhage; formation of adhesions; placental abnormalities wth subsequent pregnancies; incisional problems; thromboembolic phenomenon and other postoperative/anesthesia complications.  The patient concurred with the proposed plan, giving informed written consent for the procedure.    FINDINGS:  Viable female infant in cephalic presentation.  Apgars pending at time of note but baby is stable in the operating room with patient.  Clear amniotic fluid.  Intact placenta, three vessel cord.  Normal uterus, fallopian tubes and ovaries bilaterally.  Minimal intraperitoneal adhesive disease noted.  Liletta IUD placed after delivery of placenta. Prevena was placed as a wound dressing at the end of the procedure.  ANESTHESIA: Spinal ESTIMATED BLOOD LOSS: 400 ml SPECIMENS: Placenta sent to L&D COMPLICATIONS:  None immediate  PROCEDURE IN DETAIL:  The patient preoperatively received intravenous antibiotics and had sequential compression devices applied to her lower extremities.  She was then taken to the operating room her epidural anesthesia was found to be inadequate so  it was removed and spinal anesthesia was administered and was found to be adequate. She was then placed in a dorsal supine position with a leftward tilt, and prepped and draped in a sterile manner.  A foley catheter was placed into her bladder and attached to constant gravity.  After an adequate timeout was performed, a Pfannenstiel skin incision was made with scalpel on her preexisting scar and carried through to the underlying layer of fascia. The fascia was incised in the midline, and this incision was extended bilaterally using the Mayo scissors.  Kocher clamps were applied to the superior aspect of the fascial incision and the underlying rectus muscles were dissected off bluntly and sharply.. The rectus muscles were separated in the midline and the peritoneum was entered bluntly. The Alexis self-retaining retractor was introduced into the abdominal cavity.  Attention was turned to the lower uterine segment where a low transverse hysterotomy was made with a scalpel and extended bilaterally bluntly.  The infant was successfully delivered, the cord was clamped and cut after one minute, and the infant was handed over to the awaiting neonatology team. Uterine massage was then administered, and the placenta delivered intact with a three-vessel cord. The uterus was then cleared of clots and debris.   The Liletta IUD was placed in the fundal region, and the strings were pushed through the lower uterine segment into cervix and upper vagina. The hysterotomy was closed with 0 Vicryl in a running locked fashion, and an imbricating layer was also placed with 0 Vicryl.  Figure-of-eight 0 Vicryl serosal stitches were placed to help with hemostasis.  The pelvis  was cleared of  all clot and debris. Hemostasis was confirmed on all surfaces.  The retractor was removed.  The peritoneum and rectus muscles was reapproximated with a 0 Vicryl running stitch. The fascia was then closed using 0 PDS in a running fashion.  The subcutaneous layer was irrigated, reapproximated with 2-0 plain gut interrupted stitches, and the skin was closed with a 4-0 Vicryl subcuticular stitch. After the skin was closed, a Prevena disposable negative pressure wound therapy device was placed over the incision.  The suction was activated at a pressure of -125 mmHg.  The adhesive was affixed well and there were no leaks noted. The patient tolerated the procedure well. Sponge, instrument and needle counts were correct x 3.  She was taken to the recovery room in stable condition.    Jaynie Collins, MD, FACOG Obstetrician & Gynecologist, [facility] for [employer], [facility]

## 2020-11-10 NOTE — Progress Notes (Addendum)
Labor Progress Note Kimberly Houston is a 25 y.o. G2P1001 at [redacted]w[redacted]d presented for IOL secondary to A1GDM.  S: Pt resting comfortably.  O:  BP 136/69   Pulse (!) 55   Temp 97.7 F (36.5 C) (Oral)   Resp 16   Ht 5\' 2"  (1.575 m)   Wt 118.7 kg   LMP 02/09/2020   BMI 47.85 kg/m  EFM: baseline 130/moderate variability/+accels/no decels Toco: contractions every 2-3 min  CVE: Dilation: 2 Effacement (%): 50 Station: -3 Presentation: Vertex Exam by:: Dr.Hawkin Charo   A&P: 25 y.o. G2P1001 [redacted]w[redacted]d presented for IOL secondary to A1GDM, desires TOLAC. #TOLAC: S/p SROM for clear fluid at time of FB placement (0143 on 6/26). Pitocin started at 0200. FB still in place. Will plan for up-titration of pitocin s/p FB expulsion. #Pain: IV fentanyl #FWB: Category 1 strip #Contraception: consented for post-placental Liletta IUD #GBS positive: continued on vancomycin since admission #A1GDM: BG at goal on admission. Will continue to monitor every 4 hours in latent labor, then every 2 hours in active labor. #Rh negative: plan for rhogam workup s/p delivery #Tobacco use: pt reports smoking several cigarettes daily. Declines nicotine patch/gum/lozenges. #Anxiety/Depression: continue home zoloft 50mg  qHS.  7/26, MD 5:59 AM

## 2020-11-10 NOTE — Transfer of Care (Signed)
Immediate Anesthesia Transfer of Care Note  Patient: Kimberly Houston  Procedure(s) Performed: CESAREAN SECTION  Patient Location: PACU  Anesthesia Type:Spinal  Level of Consciousness: awake, alert , oriented and patient cooperative  Airway & Oxygen Therapy: Patient Spontanous Breathing  Post-op Assessment: Report given to RN and Post -op Vital signs reviewed and stable  Post vital signs: Reviewed and stable  Last Vitals:  Vitals Value Taken Time  BP 122/49 11/10/20 2206  Temp    Pulse 63 11/10/20 2207  Resp 22 11/10/20 2207  SpO2 99 % 11/10/20 2207  Vitals shown include unvalidated device data.  Last Pain:  Vitals:   11/10/20 2000  TempSrc:   PainSc: 10-Worst pain ever         Complications: No notable events documented.

## 2020-11-10 NOTE — Anesthesia Procedure Notes (Signed)
Epidural Patient location during procedure: OB Start time: 11/10/2020 8:04 AM End time: 11/10/2020 8:15 AM  Staffing Anesthesiologist: Achille Rich, MD Performed: anesthesiologist   Preanesthetic Checklist Completed: patient identified, IV checked, site marked, risks and benefits discussed, monitors and equipment checked, pre-op evaluation and timeout performed  Epidural Patient position: sitting Prep: DuraPrep Patient monitoring: heart rate, cardiac monitor, continuous pulse ox and blood pressure Approach: midline Location: L2-L3 Injection technique: LOR saline  Needle:  Needle type: Tuohy  Needle gauge: 17 G Needle length: 15 cm Needle insertion depth: 9 cm Catheter type: closed end flexible Catheter size: 19 Gauge Catheter at skin depth: 14 cm Test dose: negative and Other  Assessment Events: blood not aspirated, injection not painful, no injection resistance and negative IV test  Additional Notes Informed consent obtained prior to proceeding including risk of failure, 1% risk of PDPH, risk of minor discomfort and bruising.  Discussed rare but serious complications including epidural abscess, permanent nerve injury, epidural hematoma.  Discussed alternatives to epidural analgesia and patient desires to proceed.  Timeout performed pre-procedure verifying patient name, procedure, and platelet count.  Patient tolerated procedure well. Reason for block:procedure for pain

## 2020-11-10 NOTE — Progress Notes (Addendum)
S: Doing well, pain managed with Epidural, denies pelvic pressure. She has complaints for itching. Benadryl offered, pt declines at this time.     O: Vitals:   11/10/20 0821 11/10/20 0825 11/10/20 1031 11/10/20 1102  BP: (!) 108/50 (!) 125/50 (!) 103/53 (!) 90/47  Pulse: 95 69 70 (!) 58  Resp:      Temp:      TempSrc:      SpO2:      Weight:      Height:       CBG (last 3)  Recent Labs    11/10/20 0202 11/10/20 0542 11/10/20 0952  GLUCAP 100* 97 101*    FHT:  FHR: 125 bpm, variability: moderate,  accelerations:  Present,  decelerations:  Absent UC:   Difficult to trace in lateral positions d/t habitus.  SVE:   Dilation: 5 Effacement (%): 50 Station: -3 Exam by:: Rhunette Croft SNM  Procedure: Discussed with pt option for Intrauterine Pressure Catheter placement for accurate contraction measurements for pit titration. R/B discussed with the patient and family at bedside. Pt agreeable to plan.   IUPC monitor placed my SNM.   A / P: Induction of labor due to A1GDM, s/p FB, SROM and pitocin. IUPC placed for pitocin titration.  - Continue to increase pitocin 2x2 PRN.  A1GDM-q4 CBG in latent labor, q2 in active labor. Fetal Wellbeing:  Category I Pain Control:  Epidural GBS+- Vanc x1 Anticipated MOD:  TOLAC Cautious for NSVD previous C/S.  Shantonette Danella Deis) Suzie Portela, BSN, RNC-OB  Student Nurse-Midwife   11/10/2020  11:51 AM    Attestation of Supervision of Student:  I confirm that I have verified the information documented in the nurse midwife student's note and that I have also personally reperformed the history, physical exam and all medical decision making activities.  I have verified that all services and findings are accurately documented in this student's note; and I agree with management and plan as outlined in the documentation. I have also made any necessary editorial changes.    [name], CNM Center for [employer], [facility] 11/10/2020 2:41 PM

## 2020-11-10 NOTE — Progress Notes (Addendum)
S: Kimberly Houston is doing well. She states that she is starting to feel intermittent vaginal pressure.  O: Vitals:   11/10/20 1331 11/10/20 1402 11/10/20 1431 11/10/20 1502  BP: (!) 104/52 (!) 103/48 124/67 (!) 107/35  Pulse: (!) 57 (!) 55 67 (!) 54  Resp:  16    Temp:      TempSrc:      SpO2:      Weight:      Height:       CBG (last 3)  Recent Labs    11/10/20 0542 11/10/20 0952 11/10/20 1420  GLUCAP 97 101* 98    FHT:  FHR: 145 bpm, variability: moderate,  accelerations:  Present,  decelerations:  Absent UC:   regular, every 2-3 minutes SVE:   Dilation: 5 Effacement (%): 50 Station: -3 Exam by:: Rhunette Croft, SNM MVU's: Range -140-160 Vaginal Exam deferred d/t SROM, GBS+ and no sx of change in labor status.    A / P: Induction of labor due to A1 gestational diabetes, progressing on pitocin. MVUs 140-160. Continue to increase pitocin 2x2 PRN until adequate MVUs established. Fetal Wellbeing:  Category I Pain Control:  Epidural  Anticipated MOD:  TOLAC,  Caution for NSVD.  Kimberly Houston) Kimberly Houston, BSN, RNC-OB  Student Nurse-Midwife   11/10/2020  3:59 PM    Attestation of Supervision of Student:  I confirm that I have verified the information documented in the nurse midwife student's note and that I have also personally reperformed the history, physical exam and all medical decision making activities.  I have verified that all services and findings are accurately documented in this student's note; and I agree with management and plan as outlined in the documentation. I have also made any necessary editorial changes.    [name], CNM Center for [employer], [facility] 11/10/2020 6:54 PM

## 2020-11-10 NOTE — Progress Notes (Addendum)
Labor Progress Note                                                                                                                                                                                                                                                                                                                       Kimberly Houston is a 25 y.o. G2P1001 at [redacted]w[redacted]d by LMP admitted for  for A1GDM with a hx of TOLAC.   Subjective: Kimberly Houston states that she is very comfortable with her epidural. She currently denies any pelvic pain or pressure. FOB and Paternal Grandmother at bedside.   Objective: Vitals:   11/10/20 0815 11/10/20 0820 11/10/20 0821 11/10/20 0825  BP: 131/70 (!) 104/50 (!) 108/50 (!) 125/50  Pulse: 64 62 95 69  Resp: 18     Temp:      TempSrc:      SpO2:      Weight:      Height:         FHT:  FHR: 135 bpm, variability: moderate,  accelerations:  Present,  decelerations:  Absent UC:   regular q 3-4 mins.  SVE:   Dilation: 2 Effacement (%): 50 Station: -3 Exam by:: Dr.Goswick  Labs:   Recent Labs    11/10/20 0112  WBC 8.9  HGB 11.3*  HCT 35.0*  PLT 332    Assessment / Plan: G2P1001 25 y.o. [redacted]w[redacted]d Induction of labor due to gestational diabetes, diet controlled. Hx of TOLAC in 2020 for NRFHT.  S/p foley balloon, SROM @ 0145 clear fluid and now on pit. Anxiety: Stable on 50mg  Zoloft HS. Pt states she held last nights dose d/t IOL. Offered Vistaril. Declined at this time. Pt desires to resume with evening dose HS.   Labor:  Continue to increase pit 2x2 PRN A1GDM: Glucose 97 @0542 ,  -q4 CBG in latent labor, q2 in active labor. Fetal Wellbeing:  Category I Pain Control:  Epidural I/D:   GBS Positive tx with Vanc.   -  RH Negative, plan for Rhogam PRN prior to D/C.  Anticipated MOD:   Cautious for NSVD previous C/S.    Signed:  Shantonette Danella Deis) Suzie Portela, BSN, RNC-OB  Student Nurse-Midwife   11/10/2020  9:05 AM  Attestation of Supervision of Student:   I confirm that I have verified the information documented in the nurse midwife student's note and that I have also personally reperformed the history, physical exam and all medical decision making activities.  I have verified that all services and findings are accurately documented in this student's note; and I agree with management and plan as outlined in the documentation. I have also made any necessary editorial changes.  -Patient still wants to VBAC; she reports anxiety about her family leaving for home tonight while she is still here. Support and reassurance given -Reviewed Labor course; per Care Everywhere patient dilated to 4 cm on 25 units of pitocin and then had NRFHT with prolonged decels and c/section was offered and performed -Reviewed TOLAC consent in the chart   [name], CNM [facility] for [employer], [facility] 11/10/2020 9:47 AM

## 2020-11-11 ENCOUNTER — Encounter (HOSPITAL_COMMUNITY): Payer: Self-pay | Admitting: Obstetrics & Gynecology

## 2020-11-11 LAB — COMPREHENSIVE METABOLIC PANEL
ALT: 71 U/L — ABNORMAL HIGH (ref 0–44)
AST: 54 U/L — ABNORMAL HIGH (ref 15–41)
Albumin: 2 g/dL — ABNORMAL LOW (ref 3.5–5.0)
Alkaline Phosphatase: 118 U/L (ref 38–126)
Anion gap: 8 (ref 5–15)
BUN: 7 mg/dL (ref 6–20)
CO2: 19 mmol/L — ABNORMAL LOW (ref 22–32)
Calcium: 8.6 mg/dL — ABNORMAL LOW (ref 8.9–10.3)
Chloride: 105 mmol/L (ref 98–111)
Creatinine, Ser: 0.76 mg/dL (ref 0.44–1.00)
GFR, Estimated: >60 mL/min (ref >60)
Glucose, Bld: 182 mg/dL — ABNORMAL HIGH (ref 70–99)
Potassium: 4.4 mmol/L (ref 3.5–5.1)
Sodium: 132 mmol/L — ABNORMAL LOW (ref 135–145)
Total Bilirubin: 0.7 mg/dL (ref 0.3–1.2)
Total Protein: 5 g/dL — ABNORMAL LOW (ref 6.5–8.1)

## 2020-11-11 LAB — CBC
HCT: 29.7 % — ABNORMAL LOW (ref 36.0–46.0)
Hemoglobin: 9.6 g/dL — ABNORMAL LOW (ref 12.0–15.0)
MCH: 28.7 pg (ref 26.0–34.0)
MCHC: 32.3 g/dL (ref 30.0–36.0)
MCV: 88.9 fL (ref 80.0–100.0)
Platelets: 280 10*3/uL (ref 150–400)
RBC: 3.34 MIL/uL — ABNORMAL LOW (ref 3.87–5.11)
RDW: 14.6 % (ref 11.5–15.5)
WBC: 15.7 10*3/uL — ABNORMAL HIGH (ref 4.0–10.5)
nRBC: 0 % (ref 0.0–0.2)

## 2020-11-11 LAB — TYPE AND SCREEN
ABO/RH(D): A NEG
Antibody Screen: NEGATIVE

## 2020-11-11 LAB — GLUCOSE, CAPILLARY
Glucose-Capillary: 111 mg/dL — ABNORMAL HIGH (ref 70–99)
Glucose-Capillary: 135 mg/dL — ABNORMAL HIGH (ref 70–99)
Glucose-Capillary: 140 mg/dL — ABNORMAL HIGH (ref 70–99)
Glucose-Capillary: 144 mg/dL — ABNORMAL HIGH (ref 70–99)

## 2020-11-11 MED ORDER — POLYETHYLENE GLYCOL 3350 17 G PO PACK
17.0000 g | PACK | Freq: Every day | ORAL | Status: DC
  Administered 2020-11-11 – 2020-11-13 (×3): 17 g via ORAL
  Filled 2020-11-11 (×3): qty 1

## 2020-11-11 MED ORDER — IBUPROFEN 600 MG PO TABS
600.0000 mg | ORAL_TABLET | Freq: Four times a day (QID) | ORAL | Status: DC
  Administered 2020-11-11 – 2020-11-13 (×8): 600 mg via ORAL
  Filled 2020-11-11 (×10): qty 1

## 2020-11-11 MED ORDER — RHO D IMMUNE GLOBULIN 1500 UNIT/2ML IJ SOSY
300.0000 ug | PREFILLED_SYRINGE | Freq: Once | INTRAMUSCULAR | Status: AC
  Administered 2020-11-11: 300 ug via INTRAMUSCULAR
  Filled 2020-11-11: qty 2

## 2020-11-11 NOTE — Lactation Note (Addendum)
This note was copied from a baby's chart. Lactation Consultation Note  Patient Name: Kimberly Houston [id number] Date: 11/11/2020 Reason for consult: Maternal endocrine disorder;Follow-up assessment;Mother's request;Difficult latch Age:23 hours  LC not able to see a latch since infant fed 30 ml of formula with slow flow nipple prior to my arrival. Infant had hypoglycemia earlier in the day given high calorie formula to bring up glucose levels while increasing volume at each feeding.  Mom to call for assistance with latching before next feedings. Mom' s nipples are small and short shafted left > right. Mom to pre pump 5-10 minutes when infant cueing just before latching.   At time of visit, Mom complaining of nipple pain. Comfort gels provided with instructions to rinse in between use, d/c after 6 days and not to use with coconut oil. Breast shells provided to use when not pumping, sleeping or nursing. Parts, assembly usage and cleaning reviewed.   Plan 1. To feed based on cues 8-12x in 24 hr period no more than 4 hrs without a feeding, offering both breasts STS and look for swallow.           2. Mom to supplement with EBM first followed by formula with slow flow nipple and paced bottle feeding. Mom aware if infant not latching to offer more.           3. Mom to pump with DEBP q 3 hrs for 15 minutes.  All questions answered at the end of the visit.   LC returned reviewing breastfeeding volumes after latching given infant age since birth. Infant last feeding 30 ml. LC reviewed with parents pouring supplementation volume in separate bottle to prevent over feeding. Mom aware infant not latching the volume of feeding based on age is 15 ml.    LC returned found Mom pre pumped and infant latched for 10 minutes. Mom stated some uterine contractions. LC observed infant latch but did not show signs of transferring. LC assisted Mom latching on larger nipple, infant mostly popping off even with use of 20  NS.  Mom paced bottle fed infant 13 ml of formula infant emesis of curdled milk 3 ml.   LC alerted the RN, Kimberly Houston, stated Mom giving large volumes in earlier feedings but prior to that infant had been doing well.   Mom to call for assistance with next latch and to review supplementation volume and paced bottle feeding for next feeding. RN, Kimberly Houston to monitor next feeding or call LC.   Maternal Data    Feeding Mother's Current Feeding Choice: Breast Milk and Formula Nipple Type: Slow - flow  LATCH Score                    Lactation Tools Discussed/Used Tools: Pump;Flanges Flange Size: 21;24 Breast pump type: Double-Electric Breast Pump Pump Education: Setup, frequency, and cleaning;Milk Storage Reason for Pumping: increase stimulation Pumping frequency: every 3 hrs for 15 min  Interventions Interventions: Breast feeding basics reviewed;DEBP;Skin to skin;Hand express;Expressed milk;Education;Pre-pump if needed  Discharge    Consult Status Consult Status: Follow-up Date: 11/12/20 Follow-up type: In-patient    Kimberly Houston  Kimberly Houston 11/11/2020, 3:45 PM

## 2020-11-11 NOTE — Progress Notes (Addendum)
POSTPARTUM PROGRESS NOTE  Subjective: Kimberly Houston is a 25 y.o. [id number] s/p repeat LTCS at [redacted]w[redacted]d.  She reports she doing well. No acute events overnight. She denies any problems with ambulating or po intake. Foley catheter still in place. Denies nausea or vomiting. She has not passed flatus. Pain is well controlled.  Lochia is minimal.  Objective: Blood pressure 99/60, pulse 63, temperature 98 F (36.7 C), temperature source Oral, resp. rate 18, height 5\' 2"  (1.575 m), weight 118.7 kg, last menstrual period 02/09/2020, SpO2 99 %, unknown if currently breastfeeding.  Physical Exam:  General: alert, cooperative and no distress Chest: no respiratory distress Abdomen: soft, non-tender. Wound vac with good suction. Uterine Fundus: firm and at level of umbilicus Extremities: No calf swelling or tenderness  no LE edema  Recent Labs    11/10/20 1617 11/11/20 0508  HGB 12.0 9.6*  HCT 37.4 29.7*    Assessment/Plan: Janiya Millirons is a 25 y.o. G2P2002 s/p repeat LTCS at [redacted]w[redacted]d.    Routine Postpartum Care: Doing well, pain well-controlled.  -- Continue routine care, lactation support -- Contraception: s/p post-placental Liletta IUD -- Feeding: breast; formula prn -- gHTN: blood pressure in normal range today. Pt asymptomatic. LFTs downtrending today. -- A1GDM: BG 140 this AM but pt not fasting. Will plan to repeat on POD#2. -- Rh negative: given baby A+ will plan for rhogam prior to discharge -- Tobacco use: pt reports smoking several cigarettes daily. Declines nicotine patch/gum/lozenges. -- Anxiety/Depression: continue home zoloft 50mg  qHS.  Dispo: Plan for discharge POD#2-3.  [redacted]w[redacted]d, MD OB Fellow, [facility] 11/11/2020 6:42 AM

## 2020-11-11 NOTE — Social Work (Signed)
[name] was referred for history of depression/anxiety.  * Referral screened out by Clinical Social Worker because none of the following criteria appear to apply:  ~ History of anxiety/depression during this pregnancy, or of post-partum depression following prior delivery.  ~ Diagnosis of anxiety and/or depression within last 3 years OR  * [name]'s symptoms currently being treated with medication and/or therapy. Per chart review [name] takes Zoloft for anxiety and depression.   Edinburgh Postpartum Depression Screen. CSW received and acknowledges consult for EDPS of 9.  Consult screened out due to 9 on EDPS does not warrant a CSW consult.  [name] whom scores are greater than 9/yes to question 10 on Edinburgh Postpartum Depression Screen warrants a CSW consult.   Jaclynn Laumann, MSW, LCSW [facility]  Clinical Social Worker  [phone] 11/11/2020  8:25 AM  

## 2020-11-11 NOTE — Lactation Note (Signed)
This note was copied from a baby's chart. Lactation Consultation Note  Patient Name: Kimberly Houston Date: 11/11/2020 Reason for consult: Initial assessment;Maternal endocrine disorder (C/S delivery, infant had low blood sugar.) Age:23 hours LC entered the room, mom was holding infant swaddled in blankets, LC informed infant had low blood sugar. Mom not interested in  [facility] assistance with latching infant to breast at this time, although infant was cuing to BF,  per mom, infant been breastfeeding well, 1st time BF for 10 minutes and 2nd time at 2342 pm for 18 minutes. Mom has been doing hand expression and demonstrated to [facility], infant given 2 mls of colostrum by spoon. LC reviewed feeding cues with family: ;licking, kissing, hand and fist in mouth and rooting. Mom interested in pumping, wants to pump, LC explained colostrum is thick and not have high volume at first. LC suggested mom latch infant for every feeding, breastfeed infant according to hunger cues, 8 to 12 or more times within 24 hours, STS. LC assisted mom with using the DEBP, mom was pumping when LC left the room.  Mom shown how to use DEBP & how to disassemble, clean, & reassemble parts.  LC discussed infant's input and output with parents. Mom made aware of O/P services, breastfeeding support groups, community resources, and our phone # for post-discharge questions.   Mom's plan: 1- BF infant according to cues. 2- Ask for latch assistance if needed. 3- Use DEBP every 3 hours for 15 minutes on initial setting.  Maternal Data Has patient been taught Hand Expression?: Yes Does the patient have breastfeeding experience prior to this delivery?: Yes How long did the patient breastfeed?: Per mom, she BF her 23 month old  daughter for 3 weeks but had low milk supply.  Feeding Mother's Current Feeding Choice: Breast Milk  LATCH Score Latch: Repeated attempts needed to sustain latch, nipple held in mouth throughout feeding,  stimulation needed to elicit sucking reflex.  Audible Swallowing: A few with stimulation  Type of Nipple: Everted at rest and after stimulation  Comfort (Breast/Nipple): Soft / non-tender  Hold (Positioning): Assistance needed to correctly position infant at breast and maintain latch.  LATCH Score: 7   Lactation Tools Discussed/Used Tools: Pump Breast pump type: Double-Electric Breast Pump Reason for Pumping: Mom requested pumping due to her past history of having a low milk supply. Pumping frequency: Mom will use DEBP every 3 hours for 15 minutes on inital setting.  Interventions Interventions: Breast feeding basics reviewed;Assisted with latch;Skin to skin;Hand express;Expressed milk;Education  Discharge Pump: DEBP WIC Program: Yes  Consult Status Consult Status: Follow-up Date: 11/11/20 Follow-up type: In-patient    Danelle Earthly 11/11/2020, 12:57 AM

## 2020-11-11 NOTE — Progress Notes (Signed)
Patient ID: Kimberly Houston, female   DOB: [birth year], 25 y.o.   MRN: [id number]  POSTPARTUM PROGRESS NOTE  Subjective: Kimberly Houston is a 25 y.o. G2P2002 s/p repeat LTCS at [redacted]w[redacted]d.  She reports she doing well. No acute events overnight. She denies any problems with ambulating or po intake. Patient has foley catheter in place, but notes will be removed this morning. Denies nausea or vomiting. She has passed flatus. Pain is well controlled.  Lochia is minimal.  Objective: Blood pressure 99/60, pulse 63, temperature 98 F (36.7 C), temperature source Oral, resp. rate 18, height 5\' 2"  (1.575 m), weight 118.7 kg, last menstrual period 02/09/2020, SpO2 99 %, unknown if currently breastfeeding.  Physical Exam:  General: alert, cooperative and no distress Chest: no respiratory distress Abdomen: soft, non-tender. Wound vac in place with good suction.   Uterine Fundus: firm and at level of umbilicus Extremities: No calf swelling or tenderness  No lower extremity edema  Recent Labs    11/10/20 1617 11/11/20 0508  HGB 12.0 9.6*  HCT 37.4 29.7*    Assessment/Plan: Kimberly Houston is a 25 y.o. G2P2002 s/p repeat LTCS at [redacted]w[redacted]d.  Routine Postpartum Care:  -- Doing well, pain well-controlled.  -- Continue routine care, lactation support  -- Contraception: Patient is s/p post-placental placement of Liletta IUD.  -- Feeding: breast and formula feeding. -- Gestational Hypertension: Review of her blood pressures have been within the normal range today. Patient is currently asymptomatic. LFTs downtrending with AST 66->54 and ALT 86->71. -- A1GDM: Blood glucose was 140 this morning, but this was not fasting. We will plan to repeat FBG on POD#2.  -- Acute blood loss anemia: Patient's hemoglobin downtrend from 12.0-->9.6 will start PO iron today. -- Tobacco Use: Patient has been smoking several cigarettes daily. She declines nicotine patch, gum, or lozenges. -- Anxiety/Depression: Patient will  continue her home Zoloft 50 mg daily.  -- Rh negative: Baby is A+. Will plan to give RhoGAM prior to her discharge.   Dispo: Plan for discharge POD#2-3.  [redacted]w[redacted]d, Student-PA 11/11/2020 7:25 AM

## 2020-11-11 NOTE — Anesthesia Postprocedure Evaluation (Signed)
Anesthesia Post Note  Patient: Kimberly Houston  Procedure(s) Performed: CESAREAN SECTION     Patient location during evaluation: Mother Baby Anesthesia Type: Epidural Level of consciousness: awake and alert and oriented Pain management: satisfactory to patient Vital Signs Assessment: post-procedure vital signs reviewed and stable Respiratory status: respiratory function stable Cardiovascular status: stable Postop Assessment: no headache, no backache, epidural receding, patient able to bend at knees, no signs of nausea or vomiting, adequate PO intake and able to ambulate Anesthetic complications: no   No notable events documented.  Last Vitals:  Vitals:   11/11/20 0427 11/11/20 0516  BP: 99/60   Pulse: 63   Resp: 18   Temp:  36.7 C  SpO2: 99%     Last Pain:  Vitals:   11/11/20 0720  TempSrc:   PainSc: 0-No pain   Pain Goal:                   Emaly Boschert

## 2020-11-12 ENCOUNTER — Other Ambulatory Visit: Payer: Medicaid Other | Admitting: Obstetrics & Gynecology

## 2020-11-12 LAB — RH IG WORKUP (INCLUDES ABO/RH)
Fetal Screen: NEGATIVE
Gestational Age(Wks): 39
Unit division: 0

## 2020-11-12 LAB — GLUCOSE, CAPILLARY
Glucose-Capillary: 118 mg/dL — ABNORMAL HIGH (ref 70–99)
Glucose-Capillary: 72 mg/dL (ref 70–99)
Glucose-Capillary: 86 mg/dL (ref 70–99)
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
## 2020-11-12 NOTE — Social Work (Addendum)
CSW acknowledges consult.   CSW attempted to meet with [name] however FOB is sleep. CSW will attempt to see [name] at a later time.   Danny Zimny, MSW, LCSW [facility] Clinical Social Worker [phone] 11/12/2020  10:48 AM  

## 2020-11-12 NOTE — Discharge Instructions (Addendum)
No sex until your postpartum visit so we can assess your IUD  Call the office [phone]) or go to [facility] hospital for these signs of pre-eclampsia: Severe headache that does not go away with Tylenol Visual changes- seeing spots, double, blurred vision Pain under your right breast or upper abdomen that does not go away with Tums or heartburn medicine Nausea and/or vomiting Severe swelling in your hands, feet, and face
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
## 2020-11-12 NOTE — Lactation Note (Signed)
This note was copied from a baby's chart. Lactation Consultation Note  Patient Name: Kimberly Houston HTDSK'A Date: 11/12/2020   Age:23 hours  LC talked with, RN Alfonzo Feller, Mom stated to RN she will no longer need [facility] service and will formula feed only.   Maternal Data    Feeding Nipple Type: Regular  LATCH Score                    Lactation Tools Discussed/Used    Interventions    Discharge    Consult Status      Kimberly Houston  Kimberly Houston 11/12/2020, 5:33 PM

## 2020-11-12 NOTE — Progress Notes (Signed)
Patient did not call for [facility] CBG's. AC CBG obtained before breakfast.

## 2020-11-13 LAB — GLUCOSE, CAPILLARY: Glucose-Capillary: 82 mg/dL (ref 70–99)

## 2020-11-13 MED ORDER — FERROUS SULFATE 325 (65 FE) MG PO TABS: 325.0000 mg | ORAL_TABLET | ORAL | 2 refills | Status: AC

## 2020-11-13 MED ORDER — PRENATAL MULTIVITAMIN CH: 1.0000 | ORAL_TABLET | Freq: Every day | ORAL | Status: AC

## 2020-11-13 MED ORDER — IBUPROFEN 600 MG PO TABS: 600.0000 mg | ORAL_TABLET | Freq: Four times a day (QID) | ORAL | 0 refills | Status: AC

## 2020-11-13 MED ORDER — OXYCODONE-ACETAMINOPHEN 5-325 MG PO TABS: 1.0000 | ORAL_TABLET | ORAL | 0 refills | Status: AC | PRN

## 2020-11-13 NOTE — Progress Notes (Signed)
**  NOTE COPIED FROM BOY Yuliya Casique CHART**  Throughout the night of 6/28 NT Ciera answered frequent call bells asking for help with baby multiple times (8+ in 6 hour span). [name] was noticeably drowsy and attempting to get her support person/FOB to help with care for baby. During one response for help, Ciera entered room to inquire about needs when FOB requested the NT feed baby. Ciera encouraged FOB to feed newborn since he is bottle feed and FOB responded saying, "This son-of-a-b** will not let me sleep." After baby was fed, swaddled, and comfortable back in crib NT mentioned that baby should be OK for a little while. FOB replied saying, "If not, the little motherf** can just sit there and cry." NT notified staff RN Ulises Wolfinger/Shauntia, charge RN Chyrl Civatte, and new social order was placed.
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
## 2020-11-13 NOTE — Progress Notes (Signed)
C/O neck pain for past few days in back of neck that shoots sparks to head/shoulders and back   No position change makes it worse or better     states after sitting there a few minutes she gets ligjht headed   called 1st call and instr to use heat and meds(which has been given]   she immediately stated heat pack was working
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
## 2020-11-20 ENCOUNTER — Telehealth (HOSPITAL_COMMUNITY): Payer: Self-pay

## 2020-11-20 NOTE — Telephone Encounter (Signed)
No answer. Message left to return nurse call.  Marcelino Duster [facility] 11/20/2020,1950

## 2020-11-21 ENCOUNTER — Encounter: Payer: Medicaid Other | Admitting: Women's Health

## 2020-12-17 ENCOUNTER — Ambulatory Visit: Payer: Medicaid Other | Admitting: Women's Health

## 2021-12-15 IMAGING — US US MFM OB LIMITED
1 series · 15 of 28 positions shown · non-contrast
Comparison: none

[Series 1: us mfm ob limited · 33 acquisitions, 15 frames shown]
[im 1/33]
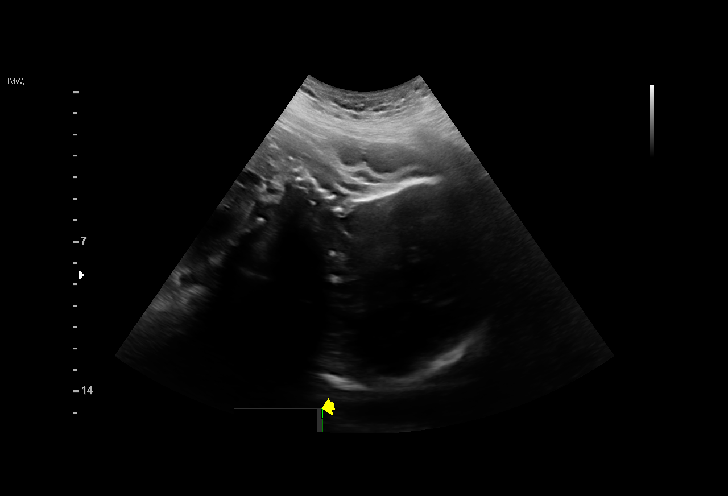
[im 3/33]
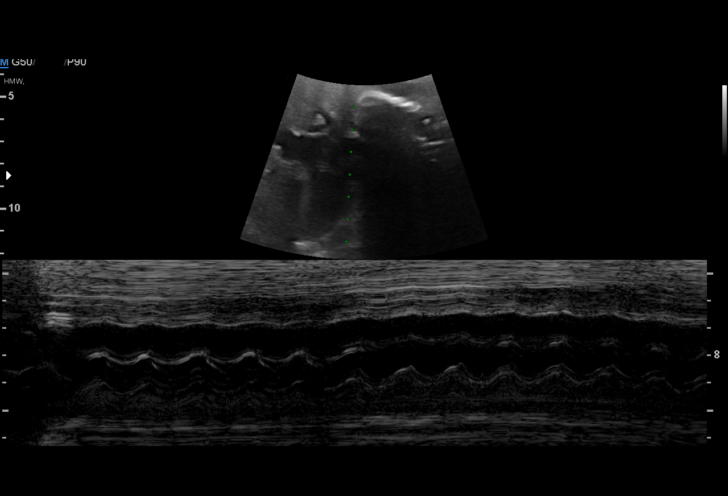
[im 5/33]
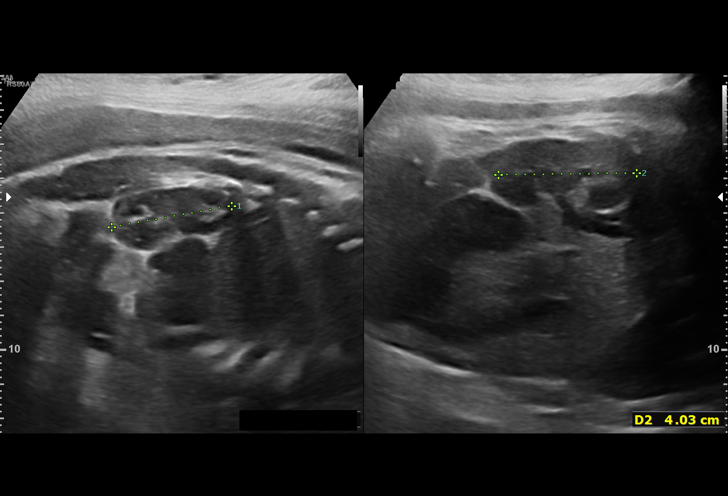
[im 8/33]
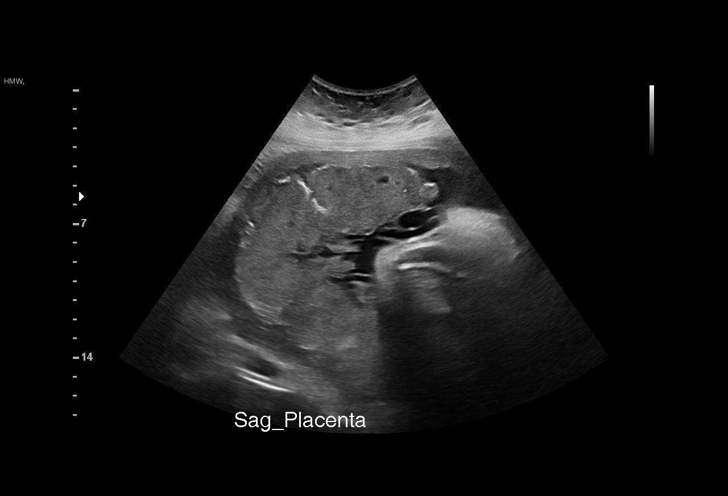
[im 10/33]
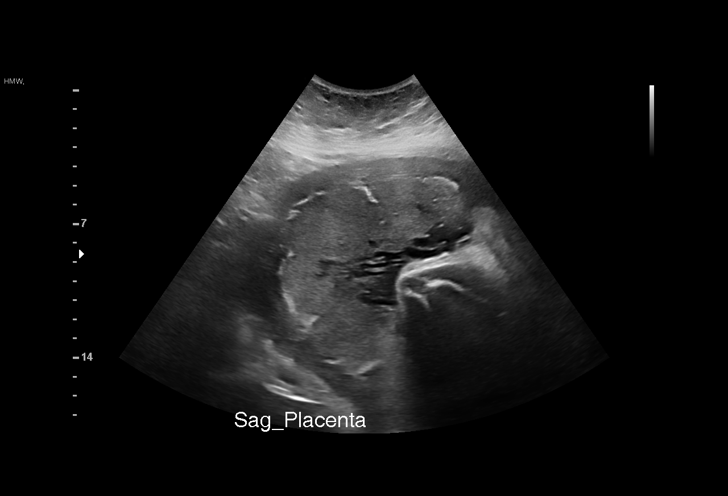
[im 12/33]
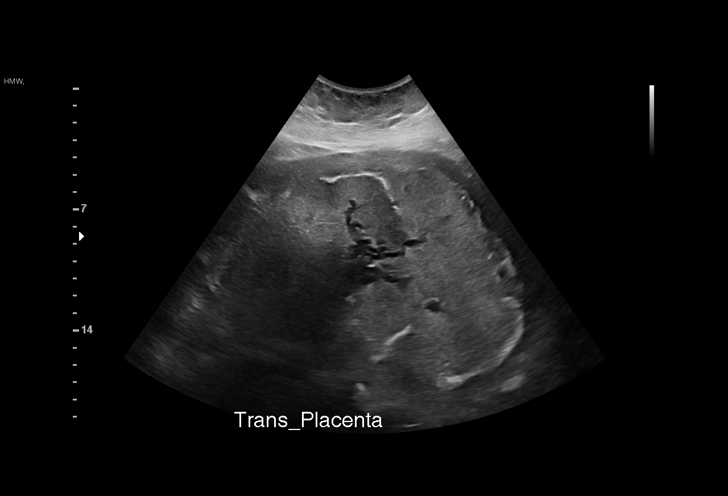
[im 15/33]
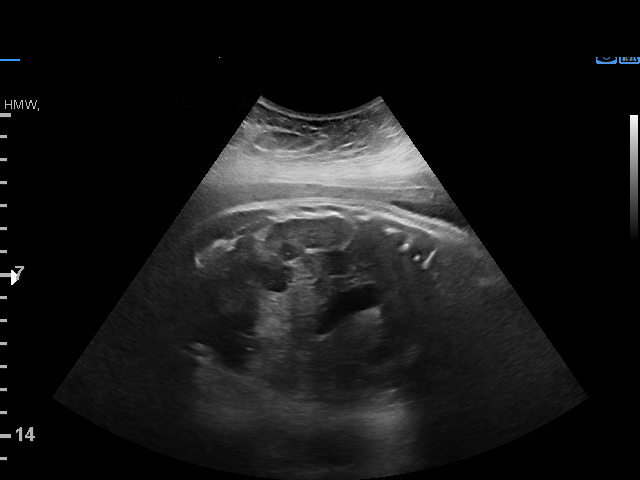
[im 17/33]
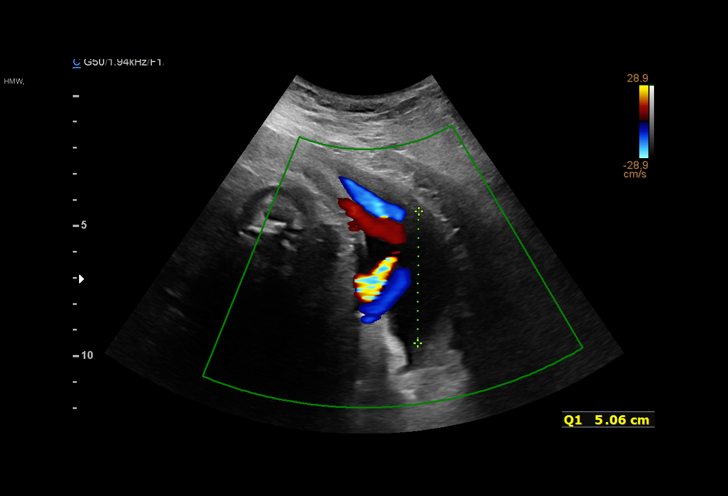
[im 18/33]
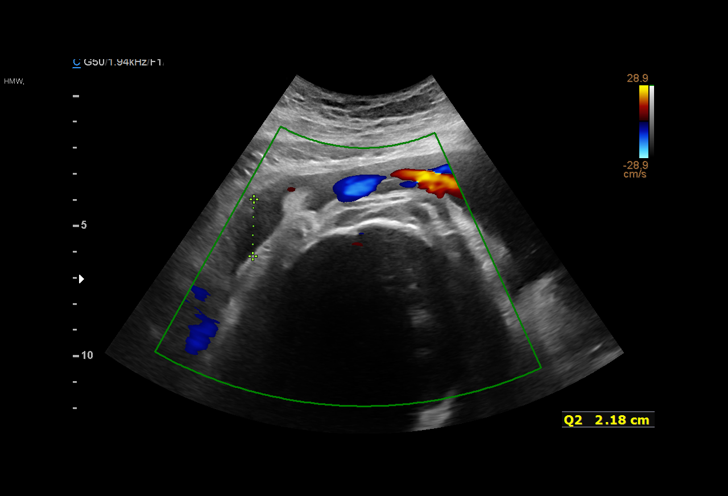
[im 21/33]
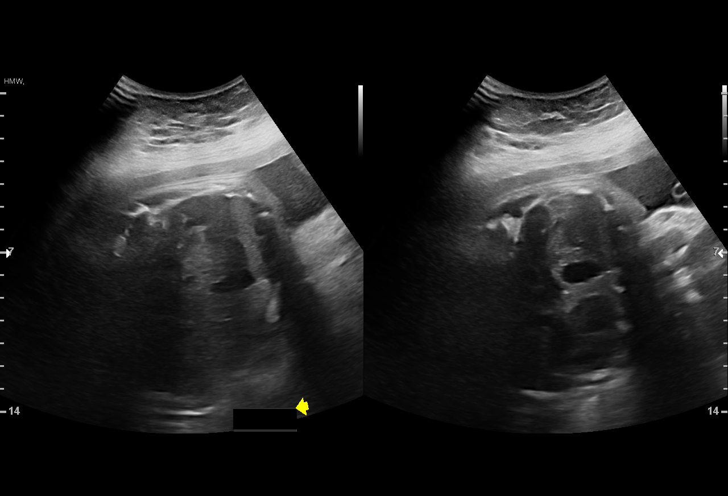
[im 23/33]
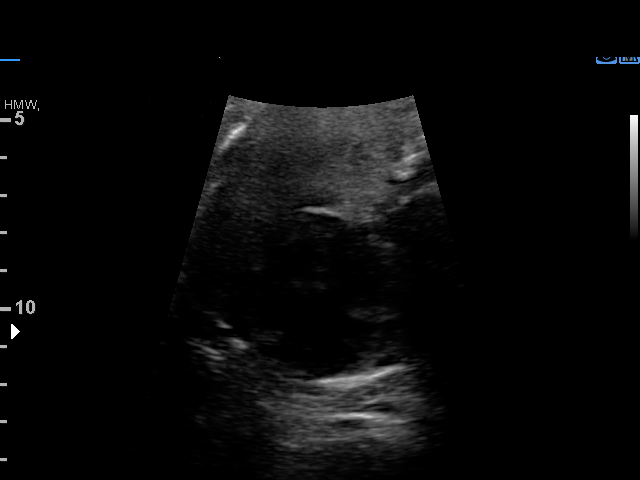
[im 25/33]
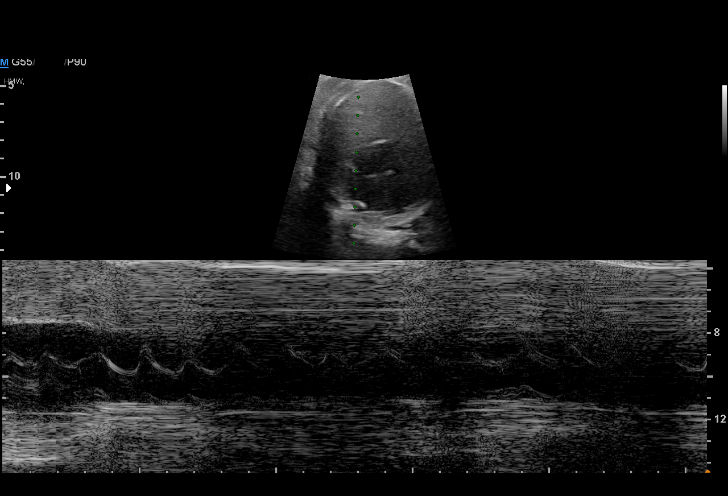
[im 28/33]
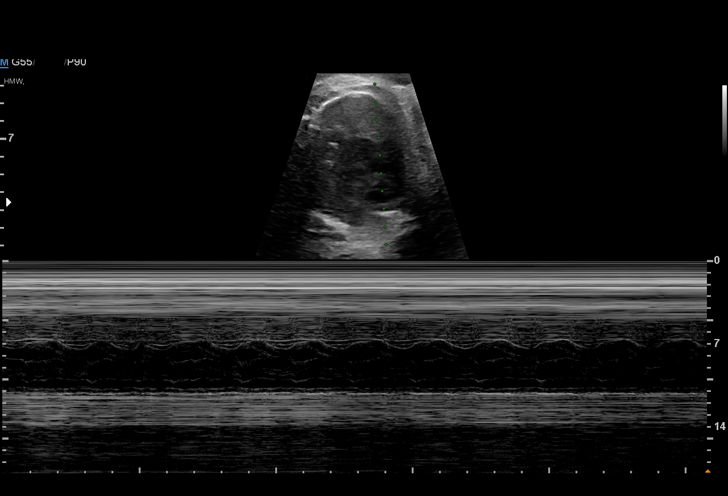
[im 30/33]
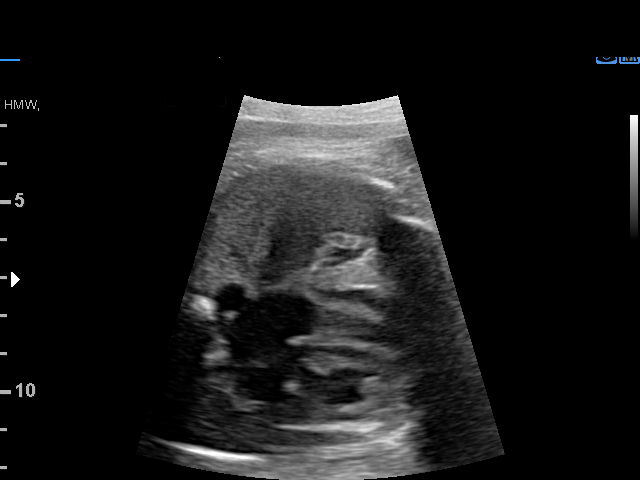
[im 33/33]
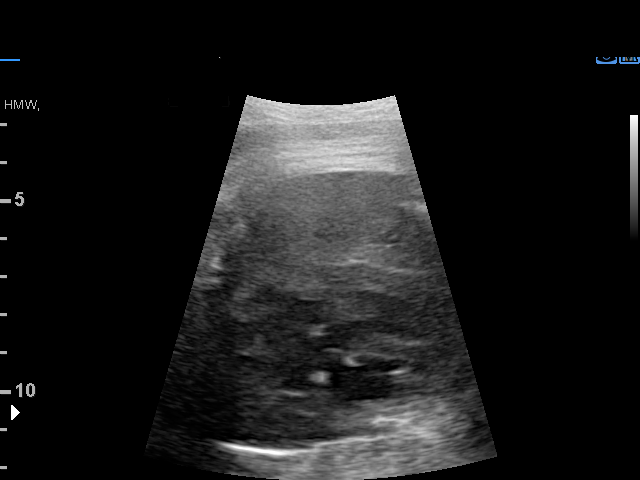

[15 of 28 positions shown; findings below may reference images not displayed]

Attending:        Pravin Stanek      Secondary Phy.:   [facility] MAU/Triage

 1  US MFM OB LIMITED                     76815.01    LILY JAFFER

Indications

 38 weeks gestation of pregnancy
 Variable fetal heart rate decelerations,
 antepartum
 History of cesarean delivery, currently
 pregnant
 Poor obstetric history: Previous gestational
 diabetes
 Poor obstetric history: Previous
 preeclampsia / eclampsia/gestational HTN
Fetal Evaluation

 Num Of Fetuses:         1
 Fetal Heart Rate(bpm):  130
 Cardiac Activity:       Arrhythmia noted
 Presentation:           Cephalic
 Placenta:               Fundal
 P. Cord Insertion:      Visualized, central

 Amniotic Fluid
 AFI FV:      Within normal limits

 AFI Sum(cm)     %Tile       Largest Pocket(cm)
 14              54

 RUQ(cm)       RLQ(cm)       LUQ(cm)        LLQ(cm)

OB History

 Gravidity:    2
 Living:       1
Gestational Age

 LMP:           38w 3d        Date:  02/09/20                 EDD:   11/15/20
 Best:          38w 3d     Det. By:  LMP  (02/09/20)          EDD:   11/15/20
Anatomy

 Thoracic:              Appears normal         Kidneys:                Appear normal
 Stomach:               Appears normal, left   Bladder:                Appears normal
                        sided
Cervix Uterus Adnexa

 Cervix
 Not visualized (advanced GA >92wks)

 Uterus
 No abnormality visualized.

 Right Ovary
 No adnexal mass visualized.

 Left Ovary
 No adnexal mass visualized.

 Cul De Sac
 No free fluid seen.

 Adnexa
 No abnormality visualized.
Impression

 Limited exam to assess fetal arrhythmia.
 There was good efetal movement and amniotic fluid.

 We observed irregular frequency of what appears to be
 premature atrial contractions vs premature ventricular
 contractions. There was no sign of super ventricular
 tachycaridia or hydrops.

 Due to fetal position, an evaluative M mode could not be
 obtained.

 Given the advanced gestational age consider in office fetal
 auscultation and consider delivery at 39 weeks if arrhythmia
 persist. She is currently scheduled for TOLAC IOL at 39
 weeks.  In addition, she had a reactive NST on [DATE].

 Lastly, consider postnatal echocardiogram.

 Fetal intermittent tachycardia may be caused by maternal
 stimulant ingestion. Consider counseling Ms. Vanderpuye
 regarding discontinueing caffiene products.
Recommendations

 Follow up as clinically indicated.
